# Patient Record
Sex: Female | Born: 1940 | Race: White | Hispanic: No | Marital: Married | Smoking: Never smoker
Health system: Southern US, Community
[De-identification: ages and names within clinical notes are randomized; demographics above are authoritative.]

---

## 2012-07-04 DIAGNOSIS — N318 Other neuromuscular dysfunction of bladder: Secondary | ICD-10-CM | POA: Insufficient documentation

## 2012-07-04 HISTORY — DX: Other neuromuscular dysfunction of bladder: N31.8

## 2012-08-19 DIAGNOSIS — G2581 Restless legs syndrome: Secondary | ICD-10-CM

## 2012-08-19 HISTORY — DX: Restless legs syndrome: G25.81

## 2013-06-12 DIAGNOSIS — H9190 Unspecified hearing loss, unspecified ear: Secondary | ICD-10-CM | POA: Insufficient documentation

## 2013-06-12 DIAGNOSIS — H903 Sensorineural hearing loss, bilateral: Secondary | ICD-10-CM

## 2013-06-12 DIAGNOSIS — I10 Essential (primary) hypertension: Secondary | ICD-10-CM

## 2013-06-12 HISTORY — DX: Sensorineural hearing loss, bilateral: H90.3

## 2013-06-12 HISTORY — DX: Essential (primary) hypertension: I10

## 2016-04-03 DIAGNOSIS — M47816 Spondylosis without myelopathy or radiculopathy, lumbar region: Secondary | ICD-10-CM | POA: Insufficient documentation

## 2016-04-03 DIAGNOSIS — M51369 Other intervertebral disc degeneration, lumbar region without mention of lumbar back pain or lower extremity pain: Secondary | ICD-10-CM

## 2016-04-03 DIAGNOSIS — M5416 Radiculopathy, lumbar region: Secondary | ICD-10-CM | POA: Insufficient documentation

## 2016-04-03 DIAGNOSIS — M5136 Other intervertebral disc degeneration, lumbar region: Secondary | ICD-10-CM | POA: Insufficient documentation

## 2016-04-03 HISTORY — DX: Other intervertebral disc degeneration, lumbar region without mention of lumbar back pain or lower extremity pain: M51.369

## 2016-04-03 HISTORY — DX: Other intervertebral disc degeneration, lumbar region: M51.36

## 2016-04-03 HISTORY — DX: Spondylosis without myelopathy or radiculopathy, lumbar region: M47.816

## 2016-04-03 HISTORY — DX: Radiculopathy, lumbar region: M54.16

## 2016-06-04 DIAGNOSIS — F29 Unspecified psychosis not due to a substance or known physiological condition: Secondary | ICD-10-CM | POA: Insufficient documentation

## 2016-06-08 NOTE — Progress Notes (Signed)
Psychiatric Initial Adult Assessment   Patient Identification: Ariel Archer MRN:  [id number] Date of Evaluation:  06/09/2016 Referral Source: [facility]  Chief Complaint:  "I haven't talked with Alcario Drought for years" Visit Diagnosis:    ICD-9-CM ICD-10-CM   1. 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    She states that she presents to the clinic as her husband advised to come. She states that she was brought to [facility], although she is unsure what happened. She talks about the neighbor named Cicero Duck, who can read her mind. She believes that Alcario Drought can hear what she says in the house, although Asuna thinks that is "not right". She went to talk with Alcario Drought, although she hasn't spoken with her for many years. She believes that Alcario Drought came to this clinic before, stating that "she was clearly reading the signs..she said this was the first floor." She was perseverative on this topic, stating that She had a person named Jamey Reas, who she went to the school together in [place]. She then states that "but this Alcario Drought is not Graciela Husbands... But they both has [employer] shop." She then sobbed, stating that they have not spoken with each other for many years. Although she states that she has heard Erika's voice this morning and on the way to be here, she states that "it's all gone as we talked yesterday."  She denies feeling depressed. She denies insomnia, although she could not sleep for 3 days prior to admission. She reports good energy. She denies SI, HI, VH. She denies decreased need for sleep or euphoria. She denies anxiety. She denies persecutory delusion. She denies  memory loss, stating that she lost track of time as she was in the hospital. She denies alcohol use or drug use.   Her husband [name] presents to the interview.  He believes that Brittney has been doing much better since discharge. However, he talks about an episode last night while playing cards with Brinkley. She suddenly reports that she needs to meet with a neighbor and went out of the house. She also was talking with Cicero Duck while driving to the clinic. He denies any safety issues. She takes medication as instructed. He believes that she has been forgetful, stating that she tends to [employer] card games. He sates that she has never had the similar issues before presenting to the hospital.   Per chart review, patient was admitted to [facility] in 3/23-3/25/2018.  "She says that someone e put a chip in her house as a joke and it was probably some one she knows, and since she has been hearing people talking and they can hear her. Pt says she can hear her neighbors talking and one said "i'm not going to go over there and take it out (in reference to the chip). Pt's husband confirms that they do not share walls with neighbors and there is no way she can hear them and he does not. She states "they keep saying something about a sticker". Pt reports that there has been some "friction" between her and her  neighbors and some of them are aggressive. Pt says she has even heard "them" saying what she is thinking and repeats things that she has said. Pt states "just because he can't hear them that doesn't make me crazy" (in reference to her husband). Pt states she does not know how they got into her house but knows they are playing a trick on her and put the bug in there. Pt denies SI/HI/Psychosis at this time." "Collateral Information Spoke with Scot JunSteve Roedl , spouse, in person , who states pt has been hearing voices and answering them back in the last few weeks. He says they have increased in the last 3-4 days and he  found out today she has not slept in 3 nights. Pt reportedly is difficult to understand at times and has a difficult time in conversation as she mixes words and letters. Her husband reports that she has had a feud with the neighbors behind them but is not able to hear them, she reportedly told Brett CanalesSteve that they were digging up her garden and wanted him to go see at 4 am. He also reports that he had another neighbor come over tonight and try to ensure her that they could not hear anything and at that time she refused to go to the hospital but she later woke him up saying someone had poisoned her and she wanted to go to the hospital. Brett CanalesSteve reports that pt has been having a difficult time lately and issues with short term memory and is short tempered but displaying no aggression. He denies any prior MH issues but says her mother did have some but he does not know what. " Per chart, no evidence of chronic psychotic disorder or dementia.  MMSE/MOCA/TSH/folate/B12/head CT are not available in the chart.   Associated Signs/Symptoms: Depression Symptoms:  denies (Hypo) Manic Symptoms:  denies Anxiety Symptoms:  denies Psychotic Symptoms:  Paranoia, PTSD Symptoms: NA  Past Psychiatric History:  Outpatient: denies Psychiatry admission: [facility] in 3/23-3/25/2018.  Previous suicide attempt: denies Past trials of medication: perphenazine History of violence: denies  Previous Psychotropic Medications: Yes   Substance Abuse History in the last 12 months:  No.  Consequences of Substance Abuse: NA  Past Medical History: No past medical history on file. No past surgical history on file.  Family Psychiatric History:  denies  Family History: No family history on file.  Social History:   Social History   Social History  . Marital status: Married    Spouse name: N/A  . Number of children: N/A  . Years of education: N/A   Social History Main Topics  . Smoking status: Never Smoker  . Smokeless  tobacco: Never Used  . Alcohol use No     Comment: 06-09-2016 per pt rarely  . Drug use: No  . Sexual activity: Not Asked   Other Topics Concern  . None   Social History Narrative  . None    Additional Social History:  Lives with her husband of 55 years, two children, her son lives in [place]  Born [place], [place], [place] years ago where her cousin lives. 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 L-METHYLFOLATE CALCIUM PO Take 1 tablet by mouth daily.    Marland Kitchen LORazepam (ATIVAN) 1 MG tablet Take 1 mg by mouth at bedtime as needed.    Marland Kitchen Multi Vit-Fluoride-Folic Acid (MULTIVITAMIN/FLUORIDE PO) Take 0.25 mg by mouth daily.    . propranolol (INDERAL) 10 MG tablet Take 10 mg by mouth 2 (two) times daily.    Marland Kitchen rOPINIRole (REQUIP) 1 MG tablet Take 2 tablets by mouth daily.     . ARIPiprazole (ABILIFY) 10 MG tablet Take 1 tablet (10 mg total) by mouth daily. 30 tablet 0  . meloxicam (MOBIC) 15 MG tablet Take 1 tablet by mouth daily.     No current facility-administered medications for this visit.     Neurologic: Headache: No Seizure: No Paresthesias:No  Musculoskeletal: Strength & Muscle Tone: within normal limits Gait & Station: normal Patient leans: N/A  Psychiatric Specialty Exam: Review of Systems  Psychiatric/Behavioral: Positive for hallucinations. Negative for depression, memory loss, substance abuse and suicidal ideas. The patient is not nervous/anxious and does not have insomnia.   All other systems reviewed and are  negative.   Blood pressure (!) 173/78, pulse 62, height 5' 5.5" (1.664 m), weight 134 lb 6.4 oz (61 kg).Body mass index is 22.03 kg/m.  General Appearance: Well Groomed  Eye Contact:  Good  Speech:  Clear and Coherent  Volume:  Normal  Mood:  "good"  Affect:  Labile (becomes tearful when she talks about her neighbor )  Thought Process:  Coherent and Goal Directed  Orientation:  Other:  05/31/2017, Friday, oriented to situation, self  Thought Content:  Ideas of Reference:   Paranoia and Paranoid Ideation Perceptions: AH of her neighbor (more aligned with delusion), denies VH  Suicidal Thoughts:  No  Homicidal Thoughts:  No  Memory:  Immediate;   Fair Recent;   Fair Remote;   Fair  Judgement:  Fair  Insight:  Shallow  Psychomotor Activity:  Normal  Concentration:  Concentration: Good and Attention Span: Good  Recall:  Good  Fund of Knowledge:Good  Language: Good  Akathisia:  No  Handed:  Right  AIMS (if indicated):  No tremors  Assets:  Communication Skills Desire for Improvement  ADL's:  Intact  Cognition: Impaired,  Mild  Sleep:  good  Clock drawing 2/3 (misplaced hands) Delayed recall 2/3   Assessment Corona Poth is a 76 year old female with no known psychiatry history with recent hallucinations/paranoia, hypertension, restless leg, lumbar degenerative disc disease who was presented for after care. She was admitted to [facility] in 3/23-3/25/2018 for paranoia and AH.  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 She does not have paranoia except this neighbor and has no other significant features of psychosis, which appears to be consistent during admission based on chart review. Although she has no known psychiatry history, she does have some cognitive impairment as described below; it is difficult to discern whether this  clinical presentation is due to the misperception secondary to dementia or she has delusional disorder. Will switch from perphenazine to Abilify given she could not tolerate higher dose due to dizziness. Discussed metabolic side effect.   # r/o neurocognitive disorder Her husband reports memory loss, although she adamantly denies any impairment. She has  signs of cognitive deficits based on mini cog, although she may be under the influence of her medication (perphenazine, ativan). Will plan to evaluate with MOCA at the next visit. Will obtain labs to rule out medically treatable cause of dementia. Will consider obtain MRI in the future.  Plan  1. Discontinue perphenazine 2. Start Abilify 10 mg daily 3. Continue lorazepam 1 mg at night as needed for insomnia 4. Continue Cerefoli, multivitamin  5. Return to clinic in two weeks for 30 mins 6. Obtain blood test (TSH, folate, Vit B12, lipids, HbA1c)  The patient demonstrates the following risk factors for suicide: Chronic risk factors for suicide include: psychiatric disorder of paranoia. Acute risk factors for suicide include: unemployment and recent discharge from inpatient psychiatry. Protective factors for this patient include: positive social support, coping skills and hope for the future. Considering these factors, the overall suicide risk at this point appears to be low. Patient is appropriate for outpatient follow up.   Treatment Plan Summary: Plan as above   Neysa Hotter, MD 3/28/20183:53 PM

## 2016-06-09 ENCOUNTER — Encounter (HOSPITAL_COMMUNITY): Payer: Self-pay | Admitting: Psychiatry

## 2016-06-09 ENCOUNTER — Encounter (INDEPENDENT_AMBULATORY_CARE_PROVIDER_SITE_OTHER): Payer: Self-pay

## 2016-06-09 ENCOUNTER — Ambulatory Visit (INDEPENDENT_AMBULATORY_CARE_PROVIDER_SITE_OTHER): Payer: Medicare Other | Admitting: Psychiatry

## 2016-06-09 VITALS — BP 173/78 | HR 62 | Ht 65.5 in | Wt 134.4 lb

## 2016-06-09 DIAGNOSIS — G3184 Mild cognitive impairment, so stated: Secondary | ICD-10-CM

## 2016-06-09 DIAGNOSIS — F22 Delusional disorders: Secondary | ICD-10-CM | POA: Diagnosis not present

## 2016-06-09 DIAGNOSIS — Z79899 Other long term (current) drug therapy: Secondary | ICD-10-CM | POA: Diagnosis not present

## 2016-06-09 MED ORDER — ARIPIPRAZOLE 10 MG PO TABS: 10.0000 mg | ORAL_TABLET | Freq: Every day | ORAL | 0 refills | Status: DC

## 2016-06-09 NOTE — Patient Instructions (Addendum)
1. Discontinue perphenazine 2. Start Abilify 10 mg daily 3. Continue lorazepam 1 mg at night as needed for insomnia 4. Continue Cerefoli, multivitamin  5. Return to clinic in two weeks for 30 mins 6. Obtain blood test

## 2016-06-10 LAB — LIPID PANEL
Cholesterol: 192 mg/dL (ref <200)
HDL: 57 mg/dL (ref >50)
LDL CALC: 114 mg/dL — AB (ref <100)
TRIGLYCERIDES: 106 mg/dL (ref <150)
Total CHOL/HDL Ratio: 3.4 Ratio (ref <5.0)
VLDL: 21 mg/dL (ref <30)

## 2016-06-10 LAB — VITAMIN B12

## 2016-06-10 LAB — FOLATE: Folate: >24 ng/mL (ref >5.4)

## 2016-06-10 LAB — TSH: TSH: 1 mIU/L

## 2016-06-11 LAB — HEMOGLOBIN A1C
Hgb A1c MFr Bld: 5.4 % (ref <5.7)
Mean Plasma Glucose: 108 mg/dL

## 2016-06-14 ENCOUNTER — Telehealth (HOSPITAL_COMMUNITY): Payer: Self-pay | Admitting: *Deleted

## 2016-06-14 NOTE — Telephone Encounter (Signed)
Patient husband called to ask about times patient should take medication and wanted to verify next appointment date.

## 2016-06-15 ENCOUNTER — Telehealth (HOSPITAL_COMMUNITY): Payer: Self-pay | Admitting: *Deleted

## 2016-06-15 NOTE — Telephone Encounter (Signed)
returned phone call, left voice message.  No detail was left on voice message regarding the phone call.

## 2016-06-16 ENCOUNTER — Telehealth (HOSPITAL_COMMUNITY): Payer: Self-pay | Admitting: *Deleted

## 2016-06-16 NOTE — Telephone Encounter (Signed)
Returned phone call regarding phone call from patient's husband, no answer, left voice message.

## 2016-06-18 NOTE — Progress Notes (Signed)
BH MD/PA/NP OP Progress Note  06/23/2016 2:36 PM Gracelee Stemmler  MRN:  [id number]  Chief Complaint:  Chief Complaint    Follow-up; Paranoid     Subjective:  "I feel fine" HPI:  Patient presents for follow-up appointment. She states that she has no issues and talks about the garden she raises flowers. When she is asked about the neighbor, she states that "they do different things.. They are jealousy." When she is asked to elaborate it, she then talks about her cousin who put tape recording in her house. She states that "that's why I was hearing voice." (when her husband is asked if he also head voice, she laughed, stating that there will be a problem if both of them hear voices.) Although her cousin initially denies this, she later admits she did it while laughing. She reports that her cousin and her "person behind me (neighbor)" are talking with each other and talking about her. She believes that they were "calling names" last week. When she is asked if she saw a neighbor, she states that "If I see them, I say hello." She denies insomnia. She denies depression/euphoria. She denies SI, VH. She denies dizziness since switching to Abilify. She denies alcohol use or drug use.  [name], her husband presents to the interview.  He states that Drake appear to be doing better, "low key." She heard voice this morning. She brought a cake to the neighbor, thinking that she was invited to the house, although nobody was there. She was states that she was told a "bad person" and called whore by the neighbor. He reports no episode of agitation or safety issues.   Wt Readings from Last 3 Encounters:  06/23/16 134 lb 9.6 oz (61.1 kg)  06/09/16 134 lb 6.4 oz (61 kg)   Visit Diagnosis:    ICD-9-CM ICD-10-CM   1. 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  Previous suicide attempt: denies Past trials of medication: perphenazine History of violence: denies  Past Medical History: No past medical history on file. No past surgical history on file.  Family Psychiatric History:  denies  Family History: No family history on file.  Social History:  Social History   Social History  . Marital status: Married    Spouse name: N/A  . Number of children: N/A  . Years of education: N/A   Social History Main Topics  . Smoking status: Never Smoker  . Smokeless tobacco: Never Used  . Alcohol use No     Comment: 06-09-2016 per pt rarely  . Drug use: No  . Sexual activity: Not Asked   Other Topics Concern  . None   Social History Narrative  . None   Additional Social History:  Lives with her husband of 55 years, two children, her son lives in [place] and grew up in [place], moved to [place], moved from [place] to [place] 7 years ago where her cousin lives. 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 ARIPiprazole (ABILIFY) 15 MG tablet Take 1 tablet (15 mg total) by mouth daily. 60 tablet 0  . L-METHYLFOLATE CALCIUM PO Take 1 tablet by mouth daily.    Marland Kitchen LORazepam (ATIVAN) 1 MG tablet Take 1 tablet (1 mg total) by mouth at bedtime as needed. 30 tablet 0  . meloxicam (MOBIC) 15 MG tablet Take 1 tablet by mouth daily.    Marland Kitchen Multi Vit-Fluoride-Folic Acid  (MULTIVITAMIN/FLUORIDE PO) Take 0.25 mg by mouth daily.    . propranolol (INDERAL) 10 MG tablet Take 10 mg by mouth 2 (two) times daily.    Marland Kitchen rOPINIRole (REQUIP) 1 MG tablet Take 2 tablets by mouth daily.      No current facility-administered medications for this visit.     Neurologic: Headache: No Seizure: No Paresthesias: No  Musculoskeletal: Strength & Muscle Tone: within normal limits Gait & Station: normal Patient leans: N/A  Psychiatric Specialty Exam: Review of Systems  Neurological: Negative for dizziness and headaches.  Psychiatric/Behavioral: Positive for hallucinations. Negative for depression, substance abuse and suicidal ideas. The patient is not nervous/anxious and does not have insomnia.   All other systems reviewed and are negative.   Blood pressure (!) 198/75, pulse (!) 59, height 5' 5.5" (1.664 m), weight 134 lb 9.6 oz (61.1 kg).Body mass index is 22.06 kg/m.  General Appearance: Well Groomed  Eye Contact:  Good  Speech:  Clear and Coherent  Volume:  Normal  Mood:  "good"  Affect:  Appropriate, Congruent and Full Range  Thought Process:  Coherent and Linear, disorganized when she is asked to elaborate about the neighbor.  Orientation:  Full (Time, Place, and Person) except date  Thought Content: Paranoid Ideation   Suicidal Thoughts:  No  Homicidal Thoughts:  No  Memory:  Immediate;   Good Recent;   Good Remote;   Good  Judgement:  Fair  Insight:  Present  Psychomotor Activity:  Normal  Concentration:  Concentration: Good and Attention Span: Good  Recall:  Good  Fund of Knowledge: Good  Language: Good  Akathisia:  No  Handed:  Right  AIMS (if indicated):  No tremors, very mild rigidity on her left arm  Assets:  Communication Skills Desire for Improvement  ADL's:  Intact  Cognition: Impaired,  Mild  Sleep:  good  MOCA 22/30 on 06/23/2016 (-1 for attention, -1 for language, -5 or delayed recall, -1 for orientation "April 8th")  Assessment Korey  Akamine is a 76 year old female with no known psychiatry history, hypertension, restless leg, lumbar degenerative disc disease who was originally referred after care since discharge from [facility] in 3/23-3/25/2018 for paranoia and AH. She presents for follow up appointment.   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 Although she becomes  disorganized when she talks about her neighbor, her thought process is linear otherwise and she does not have other features of psychosis. Noted that this appear to be consistent with evaluation during admission per chart review. She also has mild cognitive deficits which was evident on MOCA as  above. There is a possibility that paranoia is related to dementia. Patient has no known family history of dementia. Patient does not have any features of parkinson's syndrome to suggest Lewy's body except very mild rigidity on her left arm; will continue to monitor this and consider switch to less potent antipsychotic to avoid adverse reaction. Regardless, will obtain brain MRI to rule out organic cause of her mental status change and for evaluation of dementia. TSH, folate, vit B12 with wnl. Will increase Abilify to target her paranoia. Discussed metabolic side effect.   # Hypertension Her blood pressure is markedly elevated on each visit. She denies any dizziness or headache during the evaluation. Patient is recommended to recheck blood pressure outside of the clinic and update the clinic of her blood pressure. She will be recommended to contact her PCP if her blood pressure remains above >150.  Plan  1. Discontinue perphenazine 2. Start Abilify 10 mg daily 3. Continue lorazepam 1 mg at night as needed for insomnia 4. Continue Cerefoli, multivitamin  5. Return  to clinic in two weeks for 30 mins 6. Obtain blood test (TSH, folate, Vit B12, lipids, HbA1c)  The patient demonstrates the following risk factors for suicide: Chronic risk factors for suicide include: psychiatric disorder of paranoia. Acute risk factors for suicide include: unemployment and recent discharge from inpatient psychiatry. Protective factors for this patient include: positive social support, coping skills and hope for the future. Considering these factors, the overall suicide risk at this point appears to be low. Patient is appropriate for outpatient follow up.  Treatment Plan Summary:Plan as above  The duration of this appointment visit was 30 minutes of face-to-face time with the patient.  Greater than 50% of this time was spent in counseling, explanation of  diagnosis, planning of further management, and coordination of care.  Neysa Hotter, MD 06/23/2016, 2:36 PM

## 2016-06-23 ENCOUNTER — Telehealth (HOSPITAL_COMMUNITY): Payer: Self-pay | Admitting: *Deleted

## 2016-06-23 ENCOUNTER — Ambulatory Visit (INDEPENDENT_AMBULATORY_CARE_PROVIDER_SITE_OTHER): Payer: Medicare Other | Admitting: Psychiatry

## 2016-06-23 ENCOUNTER — Encounter (HOSPITAL_COMMUNITY): Payer: Self-pay | Admitting: Psychiatry

## 2016-06-23 VITALS — BP 198/75 | HR 59 | Ht 65.5 in | Wt 134.6 lb

## 2016-06-23 DIAGNOSIS — F22 Delusional disorders: Secondary | ICD-10-CM

## 2016-06-23 DIAGNOSIS — G3184 Mild cognitive impairment, so stated: Secondary | ICD-10-CM

## 2016-06-23 DIAGNOSIS — Z79899 Other long term (current) drug therapy: Secondary | ICD-10-CM | POA: Diagnosis not present

## 2016-06-23 MED ORDER — ARIPIPRAZOLE 15 MG PO TABS: 15.0000 mg | ORAL_TABLET | Freq: Every day | ORAL | 0 refills | Status: DC

## 2016-06-23 MED ORDER — ARIPIPRAZOLE 15 MG PO TABS: 15.0000 mg | ORAL_TABLET | Freq: Every day | ORAL | 1 refills | Status: DC

## 2016-06-23 MED ORDER — LORAZEPAM 1 MG PO TABS: 1.0000 mg | ORAL_TABLET | Freq: Every evening | ORAL | 0 refills | Status: DC | PRN

## 2016-06-23 NOTE — Telephone Encounter (Signed)
phone call from patient's husband with patient's blood pressure.  171 over 74.

## 2016-06-23 NOTE — Patient Instructions (Signed)
1. Increase Abilify 15 mg daily 2. Obtain MRI 3. Continue lorazepam 1 mg at night as needed for insomnia 4. Continue Cerefoli, multivitamin  5. Return to clinic in one month for 30 mins

## 2016-06-24 NOTE — Telephone Encounter (Signed)
phone call from patient's husband with patient's blood pressure.  171 over 74.   

## 2016-06-25 NOTE — Telephone Encounter (Signed)
Please advise them to see her PCP for hypertension.

## 2016-06-25 NOTE — Telephone Encounter (Signed)
Called pt home and cell number and was unable to reach pt. lmtcb and office number was provided on both numbers. In voicemail, staff informed pt with office hours.

## 2016-06-26 ENCOUNTER — Ambulatory Visit (HOSPITAL_BASED_OUTPATIENT_CLINIC_OR_DEPARTMENT_OTHER)
Admission: RE | Admit: 2016-06-26 | Discharge: 2016-06-26 | Disposition: A | Discharge status: Home / Self Care | Payer: Medicare Other | Source: Ambulatory Visit | Attending: Psychiatry | Admitting: Psychiatry

## 2016-06-26 DIAGNOSIS — F22 Delusional disorders: Secondary | ICD-10-CM | POA: Insufficient documentation

## 2016-06-26 DIAGNOSIS — G3184 Mild cognitive impairment, so stated: Secondary | ICD-10-CM | POA: Diagnosis present

## 2016-07-05 ENCOUNTER — Telehealth (HOSPITAL_COMMUNITY): Payer: Self-pay | Admitting: *Deleted

## 2016-07-05 NOTE — Telephone Encounter (Signed)
Spoke with pt husband and he was wondering what time to give pt her medications that Dr. Vanetta Shawl prescribes to him. Per pt husband, he just want to make sure she is taking her medications that right way.

## 2016-07-05 NOTE — Telephone Encounter (Signed)
Ariel Archer returned Fox phone call.

## 2016-07-05 NOTE — Telephone Encounter (Signed)
returned phone call to Mr. Nerio. left voice message.   He has concerns for wife's medications.

## 2016-07-05 NOTE — Telephone Encounter (Signed)
Called husband back due to previous phone call. Pt picked up the call and stated her husband went out to run some errands. Asked pt if she remembered the reason husband called office. Per pt she remember not having the energy to stand and was very dizzy. Per pt, her husband called her PCP and spoke with a nurse and they went over her medications but she do not remember anymore information. Pt stated she will have husband call office back to give office more details due to not being able to remember information. Staff provided pt with office number and pt stated she wrote it down.

## 2016-07-06 NOTE — Telephone Encounter (Signed)
O would suggest she take the abilify at night and ativan as needed at night also

## 2016-07-07 NOTE — Telephone Encounter (Signed)
Called pt husband to inform him with what provider stated. Per pt husband he verbalized understanding.

## 2016-07-21 NOTE — Progress Notes (Signed)
BH MD/PA/NP OP Progress Note  07/26/2016 1:39 PM Ariel Archer  MRN:  [id number]  Chief Complaint:  Chief Complaint    Follow-up; Paranoid     Subjective:  "I feel floating" HPI:  - Brain MRI with no significant finding.  Patient presents for follow-up appointment. She states that she feels bloating after increasing Abilify. Although she went on the cruise, it was difficult for her to handle dizziness. She has not had any voices or paranoia since the last appointment. She had occasional insomnia and takes ativan a few times. She has good appetite. She denies SI, HI, AH/VH. She denies feeling anxious. She denies any medication change other than Abilify.   Brett CanalesSteve, her husband presents to the interview.  She seems to be doing very well since the last appointment. She has not had any paranoia or voices. They get along well with the neighbor as well. Brett CanalesSteve has no concern other than her dizziness.   Wt Readings from Last 3 Encounters:  07/26/16 134 lb 6.4 oz (61 kg)  06/23/16 134 lb 9.6 oz (61.1 kg)  06/09/16 134 lb 6.4 oz (61 kg)   Visit Diagnosis:    ICD-9-CM ICD-10-CM   1. 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  Previous suicide attempt: denies Past trials of medication: perphenazine History of violence: denies  Past Medical History: No past medical history on file. No past surgical history on file.  Family Psychiatric History:  denies  Family History: No family history on file.  Social History:  Social History   Social History  . Marital status: Married    Spouse name: N/A  . Number of children: N/A  . Years of education: N/A   Social History Main Topics  . Smoking status: Never Smoker  . Smokeless tobacco: Never Used  . Alcohol use No     Comment: 06-09-2016 per pt rarely  . Drug use: No  . Sexual activity: Not Asked   Other Topics Concern  . None   Social History Narrative   . None   Additional Social History:  Lives with her husband of 55 years, two children, her son lives in [place]  Born [place], [place] ago where her cousin lives. 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 ARIPiprazole (ABILIFY) 10 MG tablet Take 1 tablet (10 mg total) by mouth daily. 30 tablet 1  . L-METHYLFOLATE CALCIUM PO Take 1 tablet by mouth daily.    Marland Kitchen. LORazepam (ATIVAN) 1 MG tablet Take 1 tablet (1 mg total) by mouth at bedtime as needed. 30 tablet 0  . meloxicam (MOBIC) 15 MG tablet Take 1 tablet by mouth daily.    Marland Kitchen. Multi Vit-Fluoride-Folic Acid (MULTIVITAMIN/FLUORIDE PO) Take 0.25 mg  by mouth daily.    Marland Kitchen rOPINIRole (REQUIP) 1 MG tablet Take 2 tablets by mouth daily.     Marland Kitchen lisinopril (PRINIVIL,ZESTRIL) 40 MG tablet Take 40 mg by mouth daily.  3  . propranolol (INDERAL) 10 MG tablet Take 10 mg by mouth 2 (two) times daily.     No current facility-administered medications for this visit.     Brain MRI 06/26/2016 FINDINGS: Brain: Mild atrophy and white matter changes are within normal limits for age. No acute infarct, hemorrhage, or mass lesion is present. The ventricles are of normal size. No significant extraaxial fluid collection is present. The ventricles are proportionate to the degree of atrophy.  Vascular: Flow is present in the major  intracranial arteries.  Skull and upper cervical spine: The skull base is within normal limits. The craniocervical junction is normal. Midline sagittal structures are within normal limits.  Sinuses/Orbits: Mild mucosal thickening is present in the maxillary sinuses bilaterally. No fluid levels are present. The remaining paranasal sinuses and mastoid air cells are clear. Globes and orbits are within normal limits.  IMPRESSION: 1. Normal MRI appearance of the brain for age.  Neurologic: Headache: No Seizure: No Paresthesias: No  Musculoskeletal: Strength & Muscle Tone: within normal limits Gait & Station: normal Patient leans: N/A  Psychiatric Specialty Exam: Review of Systems  Neurological: Positive for dizziness. Negative for headaches.  Psychiatric/Behavioral: Negative for depression, hallucinations, substance abuse and suicidal ideas. The patient has insomnia. The patient is not nervous/anxious.   All other systems reviewed and are negative.   Blood pressure (!) 169/69, pulse (!) 58, height 5' 5.5" (1.664 m), weight 134 lb 6.4 oz (61 kg).Body mass index is 22.03 kg/m.  General Appearance: Well Groomed  Eye Contact:  Good  Speech:  Clear and Coherent  Volume:  Normal  Mood:  "good"  Affect:  Appropriate, Congruent and Full Range  Thought Process:  Coherent and Linear  Orientation:  Full (Time, Place, and Person)   Thought Content: Paranoid Ideation   Suicidal Thoughts:  No  Homicidal Thoughts:  No  Memory:  Immediate;   Good Recent;   Good Remote;   Good  Judgement:  Fair  Insight:  Present  Psychomotor Activity:  Normal  Concentration:  Concentration: Good and Attention Span: Good  Recall:  Good  Fund of Knowledge: Good  Language: Good  Akathisia:  No  Handed:  Right  AIMS (if indicated):  No tremors, very mild rigidity on her left arm  Assets:  Communication Skills Desire for Improvement  ADL's:  Intact  Cognition: Impaired,  Mild  Sleep:  good  MOCA  22/30 on 06/23/2016 (-1 for attention, -1 for language, -5 or delayed recall, -1 for orientation "April 8th")  Assessment Ariel Archer is a 76 year old female with no known psychiatry history, hypertension, restless leg, lumbar degenerative disc disease who was originally referred after care since discharge from [facility] in 3/23-3/25/2018 for paranoia and AH. She presents for follow up appointment for paranoia.   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 After having discussion of different doses with the patient, will decrease to the original dose of 10 mg given patient significant concern about dizziness. She is instructed to contact the office if she continues to have dizziness and/or any worsening in her paranoia/AH. It  is still difficult to discern the cause of her paranoia and mild disorganization when she talks about her paranoia. Differential including delusional disorder or dementia given cognitive deficits she has on MOCA. Patient does not have any features of parkinson's syndrome to suggest Lewy's body except very mild rigidity on her left arm; will continue to monitor this and consider switch to less potent antipsychotic to avoid adverse reaction. MRI with no significant abnormality except mild atrophy and white matter changes which are within normal limits for age. TSH, folate, vit B12 with wnl. Will continue to monitor her cognition.   # Hypertension She is advised to contact her PCP for treatment for hypertension.   Plan  1. Decrease Abilify 10 mg daily 2. Continue lorazepam 1 mg at night as needed for insomnia, dispensed for 30 days 3. Continue Cerefoli, multivitamin  4. Return to clinic in two weeks for 30 mins  The patient demonstrates  the following risk factors for suicide: Chronic risk factors for suicide include: psychiatric disorder of paranoia. Acute risk factors for suicide include: unemployment and recent discharge from inpatient psychiatry. Protective factors for this patient include: positive social support, coping skills and hope for the future. Considering these factors, the overall suicide risk at this point appears to be low. Patient is appropriate for outpatient follow up.  Treatment Plan Summary:Plan as above  The duration of this appointment visit was 30 minutes of face-to-face time with the patient.  Greater than 50% of this time was spent in counseling, explanation of  diagnosis, planning of further management, and coordination of care.  Neysa Hotter, MD 07/26/2016, 1:39 PM

## 2016-07-26 ENCOUNTER — Encounter (HOSPITAL_COMMUNITY): Payer: Self-pay | Admitting: Psychiatry

## 2016-07-26 ENCOUNTER — Ambulatory Visit (INDEPENDENT_AMBULATORY_CARE_PROVIDER_SITE_OTHER): Payer: Medicare Other | Admitting: Psychiatry

## 2016-07-26 VITALS — BP 169/69 | HR 58 | Ht 65.5 in | Wt 134.4 lb

## 2016-07-26 DIAGNOSIS — G3184 Mild cognitive impairment, so stated: Secondary | ICD-10-CM

## 2016-07-26 DIAGNOSIS — Z79899 Other long term (current) drug therapy: Secondary | ICD-10-CM

## 2016-07-26 DIAGNOSIS — I10 Essential (primary) hypertension: Secondary | ICD-10-CM

## 2016-07-26 DIAGNOSIS — F22 Delusional disorders: Secondary | ICD-10-CM | POA: Diagnosis not present

## 2016-07-26 MED ORDER — ARIPIPRAZOLE 10 MG PO TABS: 10.0000 mg | ORAL_TABLET | Freq: Every day | ORAL | 1 refills | Status: DC

## 2016-07-26 MED ORDER — LORAZEPAM 1 MG PO TABS: 1.0000 mg | ORAL_TABLET | Freq: Every evening | ORAL | 0 refills | Status: DC | PRN

## 2016-07-26 NOTE — Patient Instructions (Signed)
1. Decrease Abilify 10 mg daily 2. Continue lorazepam 1 mg at night as needed for insomnia 3. Continue Cerefoli, multivitamin  4. Return to clinic in two weeks for 30 mins
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
## 2016-08-26 ENCOUNTER — Ambulatory Visit (INDEPENDENT_AMBULATORY_CARE_PROVIDER_SITE_OTHER): Payer: Medicare Other | Admitting: Psychiatry

## 2016-08-26 ENCOUNTER — Encounter (HOSPITAL_COMMUNITY): Payer: Self-pay | Admitting: Psychiatry

## 2016-08-26 VITALS — BP 160/98 | HR 72 | Ht 65.5 in | Wt 130.0 lb

## 2016-08-26 DIAGNOSIS — F22 Delusional disorders: Secondary | ICD-10-CM | POA: Insufficient documentation

## 2016-08-26 DIAGNOSIS — Z791 Long term (current) use of non-steroidal anti-inflammatories (NSAID): Secondary | ICD-10-CM

## 2016-08-26 DIAGNOSIS — G2581 Restless legs syndrome: Secondary | ICD-10-CM | POA: Diagnosis not present

## 2016-08-26 DIAGNOSIS — G3184 Mild cognitive impairment, so stated: Secondary | ICD-10-CM | POA: Diagnosis not present

## 2016-08-26 DIAGNOSIS — Z79899 Other long term (current) drug therapy: Secondary | ICD-10-CM | POA: Diagnosis not present

## 2016-08-26 DIAGNOSIS — I10 Essential (primary) hypertension: Secondary | ICD-10-CM

## 2016-08-26 DIAGNOSIS — M5136 Other intervertebral disc degeneration, lumbar region: Secondary | ICD-10-CM | POA: Diagnosis not present

## 2016-08-26 MED ORDER — ARIPIPRAZOLE 5 MG PO TABS: 12.5000 mg | ORAL_TABLET | Freq: Every day | ORAL | 1 refills | Status: DC

## 2016-08-26 NOTE — Patient Instructions (Addendum)
1. Increase Abilify 12.5 mg daily 2. Continue lorazepam 1 mg at night as needed for insomnia 3. Continue Cerefoli, multivitamin  4. Return to clinic in three weeks for 30 mins

## 2016-09-08 NOTE — Progress Notes (Signed)
BH MD/PA/NP OP Progress Note  09/16/2016 1:44 PM Ariel Archer  MRN:  [id number]  Chief Complaint:  Chief Complaint    Follow-up; Paranoid     Subjective:  "Thy are loud"  HPI:  - Patient was evaluated by Mr. Gracelyn Nurse, FNP for dizziness. CMP, CBC, UA with no abnormality to explain dizziness per chart.  Patient presents for follow up appointment for paranoia. She complains of dizziness which she has for a couple of months. She denies any worsening since the last appointment. Although she still hears her neighbors voice (which her husband does ot hear), she is not concerned as before, as they are not talking about her. She remembers she told [name] not going to [employer] and steal foods. Although she initially states it was a "joke," she later states that she heard voice of neigbor talking about this. She and [name] are thinking of moving the house as they are too big and they cannot take care of their yard anymore. She denies SI, HI, VH. She has not taken ativan since the last appointment.   [name], her husband states that she was doing better when she was out of the house, like cruise or graduation party.   Wt Readings from Last 3 Encounters:  09/16/16 127 lb (57.6 kg)  08/26/16 130 lb (59 kg)  07/26/16 134 lb 6.4 oz (61 kg)    Visit Diagnosis: No diagnosis found.  Past Psychiatric History:  I have reviewed the patient's psychiatry history in detail and updated the patient record. Outpatient: denies Psychiatry admission: [facility] in 3/23-3/25/2018.  Previous suicide attempt: denies Past trials of medication: perphenazine History of violence: denies  Past Medical History: History reviewed. No pertinent past medical history. History reviewed. No pertinent surgical history.  Family Psychiatric History:  I have reviewed the patient's family history in detail and updated the patient record.  Family History: History reviewed. No pertinent family history.  Social History:  Social  History   Social History  . Marital status: Married    Spouse name: N/A  . Number of children: N/A  . Years of education: N/A   Social History Main Topics  . Smoking status: Never Smoker  . Smokeless tobacco: Never Used  . Alcohol use No     Comment: 06-09-2016 per pt rarely  . Drug use: No  . Sexual activity: Not Asked   Other Topics Concern  . None   Social History Narrative  . None    Allergies: No Known Allergies  Metabolic Disorder Labs: Lab Results  Component Value Date   HGBA1C 5.4 06/10/2016   MPG 108 06/10/2016   No results found for: PROLACTIN Lab Results  Component Value Date   CHOL 192 06/10/2016   TRIG 106 06/10/2016   HDL 57 06/10/2016   CHOLHDL 3.4 06/10/2016   VLDL 21 06/10/2016   LDLCALC 114 (H) 06/10/2016     Current Medications: Current Outpatient Prescriptions  Medication Sig Dispense Refill  . ARIPiprazole (ABILIFY) 5 MG tablet Take 2.5 tablets (12.5 mg total) by mouth daily. 75 tablet 1  . L-METHYLFOLATE CALCIUM PO Take 1 tablet by mouth daily.    Marland Kitchen lisinopril (PRINIVIL,ZESTRIL) 40 MG tablet Take 40 mg by mouth daily.  3  . LORazepam (ATIVAN) 1 MG tablet Take 1 tablet (1 mg total) by mouth at bedtime as needed. 30 tablet 0  . meloxicam (MOBIC) 15 MG tablet Take 1 tablet by mouth daily.    Marland Kitchen Multi Vit-Fluoride-Folic Acid (MULTIVITAMIN/FLUORIDE PO) Take 0.25  mg by mouth daily.    Marland Kitchen. rOPINIRole (REQUIP) 1 MG tablet Take 2 tablets by mouth daily.      No current facility-administered medications for this visit.     Neurologic: Headache: No Seizure: No Paresthesias: No   Musculoskeletal: Strength & Muscle Tone: within normal limits Gait & Station: normal Patient leans: N/A  Psychiatric Specialty Exam: Review of Systems  Neurological: Positive for dizziness.  Psychiatric/Behavioral: Positive for hallucinations. Negative for depression, memory loss, substance abuse and suicidal ideas. The patient is not nervous/anxious and does not  have insomnia.   All other systems reviewed and are negative.   Blood pressure 136/90, pulse 70, height 5' 5.5" (1.664 m), weight 127 lb (57.6 kg).Body mass index is 20.81 kg/m.  General Appearance: Fairly Groomed  Eye Contact:  Good  Speech:  Clear and Coherent  Volume:  Normal  Mood:  "fine"  Affect:  anxious, slightly restricted  Thought Process:  Coherent perseverates on her neighbor  Orientation:  Full (Time, Place, and Person)  Thought Content: Paranoid Ideation Perceptions: AH of neighbor, denies VH  Suicidal Thoughts:  No  Homicidal Thoughts:  No  Memory:  Immediate;   Good Recent;   Good Remote;   Good  Judgement:  Fair  Insight:  Fair  Psychomotor Activity:  Normal  Concentration:  Concentration: Good and Attention Span: Good  Recall:  Good  Fund of Knowledge: Good  Language: Good  Akathisia:  No  Handed:  Right  AIMS (if indicated):  No tremors, no rigidity  Assets:  Communication Skills Desire for Improvement  ADL's:  Intact  Cognition: WNL  Sleep:  good  MOCA 22/30 on 06/23/2016 (-1 for attention, -1 for language, -5 or delayed recall, -1 for orientation "April 8th")  Assessment Ariel Archer is a 76 year old female with no known psychiatry history, hypertension, restless leg, lumbar degenerative disc diseasewho was originally referred after care since discharge from [facility] in 3/23-3/25/2018 for paranoia and AH. Patient presents for follow up appointment for Delusional disorder, persecutory type, first episode, currently in acute episode [facility](HCC)  # Delusional disorder Patient continues to have ego dystonic paranoia about her neighbors, although it appears to be improved slightly since uptitration of Abilify. Will increase further to target paranoia while monitoring for dizziness (she could not tolerate 15 mg). No other signs to concern for psychotic disorder. She will be continued lorazepam prn for anxiety related to paranoia.   # Mild neurocognitive  disorder She has mild cognitive deficits as evidenced in MOCA. Will continue to monitor. Head MRI, labs with no significant abnormality to explain cognitive deficits.   Plan  1. Increase Abilify 14 mg daily 2. Return to clinic in one month for 30 mins  The patient demonstrates the following risk factors for suicide: Chronic risk factors for suicide include: psychiatric disorder of paranoia. Acute risk factorsfor suicide include: unemployment and recent discharge from inpatient psychiatry. Protective factorsfor this patient include: positive social support, coping skills and hope for the future. Considering these factors, the overall suicide risk at this point appears to be low. Patient isappropriate for outpatient follow up.  The duration of this appointment visit was 30 minutes of face-to-face time with the patient.  Greater than 50% of this time was spent in counseling, explanation of  diagnosis, planning of further management, and coordination of care.  Treatment Plan Summary:Plan as above   Neysa Hottereina Emiliano Welshans, MD 09/16/2016, 1:44 PM

## 2016-09-16 ENCOUNTER — Ambulatory Visit (INDEPENDENT_AMBULATORY_CARE_PROVIDER_SITE_OTHER): Payer: Medicare Other | Admitting: Psychiatry

## 2016-09-16 ENCOUNTER — Encounter (HOSPITAL_COMMUNITY): Payer: Self-pay | Admitting: Psychiatry

## 2016-09-16 VITALS — BP 136/90 | HR 70 | Ht 65.5 in | Wt 127.0 lb

## 2016-09-16 DIAGNOSIS — G3184 Mild cognitive impairment, so stated: Secondary | ICD-10-CM | POA: Diagnosis not present

## 2016-09-16 DIAGNOSIS — R443 Hallucinations, unspecified: Secondary | ICD-10-CM

## 2016-09-16 DIAGNOSIS — F22 Delusional disorders: Secondary | ICD-10-CM | POA: Diagnosis not present

## 2016-09-16 MED ORDER — ARIPIPRAZOLE 10 MG PO TABS: ORAL_TABLET | ORAL | 1 refills | Status: DC

## 2016-09-16 MED ORDER — ARIPIPRAZOLE 2 MG PO TABS: ORAL_TABLET | ORAL | 1 refills | Status: DC

## 2016-09-16 NOTE — Patient Instructions (Signed)
1. Increase Abilify 14 mg daily 2. Return to clinic in one month for 30 mins

## 2016-09-17 ENCOUNTER — Telehealth (HOSPITAL_COMMUNITY): Payer: Self-pay | Admitting: *Deleted

## 2016-09-20 ENCOUNTER — Telehealth (HOSPITAL_COMMUNITY): Payer: Self-pay | Admitting: *Deleted

## 2016-09-20 NOTE — Telephone Encounter (Signed)
noted 

## 2016-09-20 NOTE — Telephone Encounter (Signed)
Called for prior authorization of Abilify on 09/17/16 was on hold for 1 hour, called back on 09/20/16 was given  PA #[id number] and told that pharmacist will review with a decision within 24-48 hours.

## 2016-10-12 ENCOUNTER — Telehealth (HOSPITAL_COMMUNITY): Payer: Self-pay | Admitting: *Deleted

## 2016-10-12 NOTE — Progress Notes (Signed)
BH MD/PA/NP OP Progress Note  10/18/2016 2:35 PM Ariel Archer  MRN:  [id number]  Chief Complaint:  Chief Complaint    Other; Follow-up     Subjective:  "I'm doing good" HPI:  Patient presents for follow up appointment for paranoia.  She states that she has been doing better since the last appointment. She put the house on sale, and is hoping to move out by September. She believes that her neighbor was "doing things having fun" and she tries to let it go. Although her cousin and the neighbor is friend now, she does not care it anymore as the cousin live apart from her. When she is asked about the statement of device, she states that "things are open" and she is not concerned about it anymore. She denies feeling depressed or anxiety. She has fair appetite. She denies SI, HI, AH/VH.  She denies ideas of reference.    Her husband presents at the interview He believes that she is doing much better despite being on the same dose of Abilify (could not get higher dose from pharmacy). She talks less about the neighbor.   Wt Readings from Last 3 Encounters:  10/18/16 127 lb (57.6 kg)  09/16/16 127 lb (57.6 kg)  08/26/16 130 lb (59 kg)    Visit Diagnosis:    ICD-10-CM   1. 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 Outpatient: denies Psychiatry admission: [facility] in 3/23-3/25/2018 for paranoia and AH.  Previous suicide attempt: denies Past trials of medication: perphenazine History of violence: denies  Past Medical History: No past medical history on file. No past surgical history on file.  Family Psychiatric History:  .I have reviewed the patient's family history in detail and updated the patient record.  Family History: No family history on file.  Social History:  Social History   Social History  . Marital  status: Married    Spouse name: N/A  . Number of children: N/A  . Years of education: N/A   Social History Main Topics  . Smoking status: Never Smoker  . Smokeless tobacco: Never Used  . Alcohol use No     Comment: 06-09-2016 per pt rarely  . Drug use: No  . Sexual activity: Not on file   Other Topics Concern  . Not on file   Social History Narrative  . No narrative on file    Allergies: No Known Allergies  Metabolic Disorder Labs: Lab Results  Component Value Date   HGBA1C 5.4 06/10/2016   MPG 108 06/10/2016   No results found for: PROLACTIN Lab Results  Component Value Date   CHOL 192 06/10/2016   TRIG 106 06/10/2016   HDL 57 06/10/2016   CHOLHDL 3.4 06/10/2016   VLDL 21 06/10/2016   LDLCALC 114 (H) 06/10/2016     Current Medications: Current Outpatient Prescriptions  Medication Sig Dispense Refill  . ARIPiprazole (ABILIFY) 10 MG tablet Total of 12.5 mg (10 mg +2.5 mg) daily 30 tablet 1  . L-METHYLFOLATE CALCIUM PO Take 1 tablet by mouth daily.    Marland Kitchen. lisinopril (PRINIVIL,ZESTRIL) 40 MG tablet Take 40 mg by mouth daily.  3  . LORazepam (ATIVAN) 1 MG tablet Take 1 tablet (1 mg total) by mouth at bedtime as needed. 30 tablet 0  . meloxicam (MOBIC) 15 MG tablet Take 1 tablet by mouth daily.    .Marland Kitchen  Multi Vit-Fluoride-Folic Acid (MULTIVITAMIN/FLUORIDE PO) Take 0.25 mg by mouth daily.    Marland Kitchen. rOPINIRole (REQUIP) 1 MG tablet Take 2 tablets by mouth daily.     . ARIPiprazole (ABILIFY) 5 MG tablet Total of 12.5 mg (10 mg +2.5 mg) daily 15 tablet 1   No current facility-administered medications for this visit.     Neurologic: Headache: No Seizure: No Paresthesias: No  Musculoskeletal: Strength & Muscle Tone: within normal limits Gait & Station: normal Patient leans: N/A  Psychiatric Specialty Exam: Review of Systems  Psychiatric/Behavioral: Negative for depression, hallucinations, memory loss, substance abuse and suicidal ideas. The patient is not nervous/anxious and  does not have insomnia.   All other systems reviewed and are negative.   Blood pressure (!) 158/68, pulse 66, height 5' 5.5" (1.664 m), weight 127 lb (57.6 kg).Body mass index is 20.81 kg/m.  General Appearance: Fairly Groomed  Eye Contact:  Good  Speech:  Clear and Coherent  Volume:  Normal  Mood:  "fine"  Affect:  Congruent and slightly restricted, no change  Thought Process:  Coherent, occasionally disorganized when she tries to explain her paranoia  Orientation:  Full (Time, Place, and Person)  Thought Content: Paranoid Ideation Perceptions: denies AH/VH   Suicidal Thoughts:  No  Homicidal Thoughts:  No  Memory:  Immediate;   Good Recent;   Good Remote;   Good  Judgement:  Good  Insight:  Fair  Psychomotor Activity:  Normal  Concentration:  Concentration: Good and Attention Span: Good  Recall:  Good  Fund of Knowledge: Good  Language: Good  Akathisia:  No  Handed:  Right  AIMS (if indicated):  No tremors, mild rigidity on her left arm  Assets:  Communication Skills Desire for Improvement  ADL's:  Intact  Cognition: WNL  Sleep:  good  MOCA 22/30 on 06/23/2016 (-1 for attention, -1 for language, -5 or delayed recall, -1 for orientation "April 8th")  Assessment Ariel Archer is a 76 y.o. year old female with a history of delusional disorder, hypertension, restless leg, lumbar degenerative disc disease , who presents for follow up appointment for Delusional disorder, persecutory type, first episode, currently in acute episode [facility](HCC)  # Delusional disorder Although patient continues to report paranoia, which has become less and she demonstrates less rumination on these ego dystonic thoughts. Will continue the same dose of Abilify (she could not get higher dose) to target paranoia. Will monitor mild rigidity on her left arm, which may be secondary to Abilify. Will consider switching to other medication or adding benztropine at the next encounter if no improvement in symptoms. Will  discontinue ativan given no symptoms of anxiety.   # Mild neurocognitive disorder She does have mild cognitive deficits as evidenced in MOCA. ADL/IADL independent. Will continue to monitor. Head MRI, labs with no significant abnormality to suggest treatable cause of dementia.   Plan  1. Continue Abilify 12.5 daily (10 mg + 2.5 mg) 2. Return to clinic in six weeks for 30 mins  The patient demonstrates the following risk factors for suicide: Chronic risk factors for suicide include: psychiatric disorder of paranoia. Acute risk factorsfor suicide include: unemployment and recent discharge from inpatient psychiatry. Protective factorsfor this patient include: positive social support, coping skills and hope for the future. Considering these factors, the overall suicide risk at this point appears to be low. Patient isappropriate for outpatient follow up.  Treatment Plan Summary:Plan as above  The duration of this appointment visit was 30 minutes of face-to-face time with  the patient.  Greater than 50% of this time was spent in counseling, explanation of  diagnosis, planning of further management, and coordination of care.  Neysa Hotter, MD 10/18/2016, 2:35 PM

## 2016-10-12 NOTE — Telephone Encounter (Signed)
Yes with diagnosis of delusional disorder

## 2016-10-12 NOTE — Telephone Encounter (Signed)
Per Dr. Vanetta ShawlHisada;Yes with diagnosis of delusional disorder

## 2016-10-12 NOTE — Telephone Encounter (Signed)
Received denial of drug coverage for aripiprazole 2mg  because the use is not supported by the FDA or by one of the Medicare approved references for treating your medical condition:Delusional disorders, unspecified psychosis not due to a substance or known physiological condition, paranoia, ego dystonia paranoia. Coverage requires that the drug has been recoginized for the treatment of your medical condition by one of the following Medicare compendia: 1. [facility] Formulary Service Drug Information 2. [employer] information system

## 2016-10-14 NOTE — Telephone Encounter (Signed)
Per Karie MainlandAli, The diagnosis of Delusional disorder is the one that was used and denied

## 2016-10-14 NOTE — Telephone Encounter (Signed)
The diagnosis of Delusional disorder is the one that was used and denied.

## 2016-10-15 NOTE — Telephone Encounter (Signed)
She had tried perphenazine, which resulted in drowsiness. She needs to be on antipsychotics which has less sedative/less metabolic (as she has hypertension) side effect. Any way we can appeal with this diagnosis?

## 2016-10-18 ENCOUNTER — Ambulatory Visit (INDEPENDENT_AMBULATORY_CARE_PROVIDER_SITE_OTHER): Payer: Medicare Other | Admitting: Psychiatry

## 2016-10-18 VITALS — BP 158/68 | HR 66 | Ht 65.5 in | Wt 127.0 lb

## 2016-10-18 DIAGNOSIS — F22 Delusional disorders: Secondary | ICD-10-CM | POA: Diagnosis not present

## 2016-10-18 DIAGNOSIS — F09 Unspecified mental disorder due to known physiological condition: Secondary | ICD-10-CM | POA: Diagnosis not present

## 2016-10-18 MED ORDER — ARIPIPRAZOLE 10 MG PO TABS: ORAL_TABLET | ORAL | 1 refills | Status: DC

## 2016-10-18 MED ORDER — ARIPIPRAZOLE 5 MG PO TABS: ORAL_TABLET | ORAL | 1 refills | Status: DC

## 2016-10-18 NOTE — Telephone Encounter (Signed)
Per Dr. Vanetta ShawlHisada, She had tried perphenazine, which resulted in drowsiness. She needs to be on antipsychotics which has less sedative/less metabolic (as she has hypertension) side effect. Any way we can appeal with this diagnosis?

## 2016-10-18 NOTE — Patient Instructions (Addendum)
1. Continue Abilify 12.5 daily 2. Return to clinic in six weeks for 30 mins

## 2016-10-20 ENCOUNTER — Telehealth (HOSPITAL_COMMUNITY): Payer: Self-pay | Admitting: *Deleted

## 2016-10-20 NOTE — Telephone Encounter (Signed)
[facility]. They will call office with decision.

## 2016-10-20 NOTE — Telephone Encounter (Signed)
Called for appeal of Abilify. [facility] who states a decision will be made and called to office staff.

## 2016-10-21 NOTE — Telephone Encounter (Signed)
Noted  

## 2016-10-21 NOTE — Telephone Encounter (Signed)
noted 

## 2016-10-22 ENCOUNTER — Telehealth (HOSPITAL_COMMUNITY): Payer: Self-pay | Admitting: *Deleted

## 2016-10-22 NOTE — Telephone Encounter (Signed)
[facility] staff Trey PaulaJeff called to speak with Dr. Vanetta ShawlHisada about pt medication. They are calling about a denial for pt medication that provider prescribed. Per Trey PaulaJeff, his number is [phone] and ext [phone]. Per Trey PaulaJeff he would like for provider to call him before the day ends. Per Trey PaulaJeff pt medication is about the Abilify.

## 2016-10-22 NOTE — Telephone Encounter (Signed)
He was not available and left a voice message. May try next week.

## 2016-10-25 NOTE — Telephone Encounter (Signed)
Left voice message again to contact the office.

## 2016-10-27 ENCOUNTER — Telehealth (HOSPITAL_COMMUNITY): Payer: Self-pay | Admitting: *Deleted

## 2016-10-27 NOTE — Telephone Encounter (Signed)
Pt medication was approved by her insurance.

## 2016-10-27 NOTE — Telephone Encounter (Signed)
Pt insurance called and lm with approval for pt medication that was recently needing a PA. Approval number is [id number] from July 9th until 03-14-17.

## 2016-11-16 ENCOUNTER — Telehealth (HOSPITAL_COMMUNITY): Payer: Self-pay | Admitting: *Deleted

## 2016-11-16 NOTE — Telephone Encounter (Signed)
[place] [facility] [place] request for pt Lorazepam 1 mg QHS PRN. Per pt chart, pt medication was last filled on 07-26-2016 with 30 tabs 0 refill. Per pt chart, pt saw provider on 10-18-16 but only received Abilify refills. Per pt chart, the last chart note that provider mentioned to pt about continuing his Lorazepam PRN was 08-26-2016. Per pt chart, medication is still in current list of meds. Pt f/u appt is 11-29-2016. Pt pharmacy number is [phone].

## 2016-11-16 NOTE — Telephone Encounter (Signed)
Just fill enough to last until appt

## 2016-11-19 ENCOUNTER — Telehealth (HOSPITAL_COMMUNITY): Payer: Self-pay | Admitting: *Deleted

## 2016-11-19 NOTE — Telephone Encounter (Signed)
Opened in Error.

## 2016-11-19 NOTE — Telephone Encounter (Signed)
Called pt to verify if she needs more refills. Staff was not able to reach pt and lmtcb and office number was provided and office hours was provided.

## 2016-11-22 ENCOUNTER — Telehealth (HOSPITAL_COMMUNITY): Payer: Self-pay | Admitting: *Deleted

## 2016-11-22 MED ORDER — LORAZEPAM 1 MG PO TABS: 1.0000 mg | ORAL_TABLET | Freq: Every evening | ORAL | 0 refills | Status: DC | PRN

## 2016-11-22 NOTE — Telephone Encounter (Signed)
Medication called into pt pharmacy and spoke with Caryn BeeKevin

## 2016-11-22 NOTE — Telephone Encounter (Signed)
Per previous note, provider approved staff to call in pt Lorazepam 1 mg QHS PRN. Called pt pharmacy and spoke with Caryn BeeKevin and 30 tabs 0 refill due to pt will have to pay for 30 tabs supply even if its only for a couple days. Per pharmacy it will be cost affective if pt received 30 days instead 7 days worth. Staff called in 30 days and message will be sent to provider.

## 2016-11-22 NOTE — Telephone Encounter (Signed)
I believe that patient has not taken that medication for a while. Asks patient if she would like to have a refill (if that is already ordered, it is fine as well)

## 2016-11-23 ENCOUNTER — Telehealth (HOSPITAL_COMMUNITY): Payer: Self-pay | Admitting: *Deleted

## 2016-11-23 NOTE — Telephone Encounter (Signed)
Called pt on home number and was unable to reach pt. Office number provided on voicemail.

## 2016-11-23 NOTE — Telephone Encounter (Signed)
Called pt mobile number on file and pt husband picked up. Spoke with pt husband and he stated still do take her Lorazepam but she takes it PRN. Per pt husband they picked up medication yesterday. Staff informed provider verbally with information and provider verbalized understanding.

## 2016-11-23 NOTE — Telephone Encounter (Signed)
Received fax from [employer] stating that appeal of Aripiprazole 2mg  has been approved until 03/14/17. Auth #[id number].

## 2016-11-23 NOTE — Telephone Encounter (Signed)
noted 

## 2016-11-25 ENCOUNTER — Telehealth (HOSPITAL_COMMUNITY): Payer: Self-pay | Admitting: *Deleted

## 2016-11-25 NOTE — Telephone Encounter (Signed)
Pt husband called stating pt pharmacy informed him they do not have refills for pt Abilify 10 mg. Per pt chart, pt medication was last filled on 10-18-2016 with 30 tabs 1 refill. Informed pt husband with information and he stated they informed him yesterday they did not have one. Staff called pt pharmacy and spoke with Tammy who stated pt does have Abilify 10 mg 1 refill left. Asked Tammy if they could get medication ready for her. Per Tammy they will get it ready. Staff called pt home and husband mobile and lm on pt husband mobile informing him to go by pt pharmacy and if he needed any moe assistance to feel free to call office back. Office number was provided on husband mobile.

## 2016-11-25 NOTE — Telephone Encounter (Signed)
Perr previous note on file about who staff spoke with when staff called pt pharmacy, the name of who staff spoke with is wrong. This is a correct to previous note. The name staff spoke with was [place].

## 2016-11-29 ENCOUNTER — Ambulatory Visit (HOSPITAL_COMMUNITY): Payer: Medicare Other | Admitting: Psychiatry

## 2016-11-29 NOTE — Progress Notes (Signed)
BH MD/PA/NP OP Progress Note  12/01/2016 2:34 PM Ariel Archer  MRN:  [id number]  Chief Complaint:  Chief Complaint    Follow-up; Paranoid     HPI:  Patient presents for follow up appointment for delusional disorder. She states that she has been anxious and feels tired of preparation of relocation. She feels frustrated that things are not in order. She has not met a neighbor who travels to [place] and denies any concern about them. She denies having any device at home so that they can hear her conversation (which she reported in the previous visits). She denies ideas of reference. She denies AH, VH. She reports occasional insomnia due to think about things needs to be done before moving tomorrow. She denies panic attacks.   Her husband presents to the interview.  She has not mentioned anything about the neighbor. She appears to be anxious as she is "[place]" who needs to do things in order. She seems to be cautions when she walks so that she does not trip.    Per [facility] filled on 07/04/2016 (Staff called in [place] on 9/10)  Wt Readings from Last 3 Encounters:  12/01/16 120 lb 9.6 oz (54.7 kg)  10/18/16 127 lb (57.6 kg)  09/16/16 127 lb (57.6 kg)   Visit Diagnosis:    ICD-10-CM   1. Delusional disorder, persecutory type, first episode, currently in acute episode ([place]) [name]     Past Psychiatric History:  I have reviewed the patient's psychiatry history in detail and updated the patient record. Outpatient: denies Psychiatry admission: [facility] in 3/23-3/25/2018 for paranoia and AH.  Previous suicide attempt: denies Past trials of medication: perphenazine History of violence: denies  Past Medical History: No past medical history on file. No past surgical history on file.  Family Psychiatric History:  I have reviewed the patient's family history in detail and updated the patient record.  Family History: No family history on file.  Social History:  Social History    Social History  . Marital status: Married    Spouse name: N/A  . Number of children: N/A  . Years of education: N/A   Social History Main Topics  . Smoking status: Never Smoker  . Smokeless tobacco: Never Used  . Alcohol use No     Comment: 06-09-2016 per pt rarely  . Drug use: No  . Sexual activity: Not Asked   Other Topics Concern  . None   Social History Narrative  . None    Allergies: No Known Allergies  Metabolic Disorder Labs: Lab Results  Component Value Date   HGBA1C 5.4 06/10/2016   MPG 108 06/10/2016   No results found for: PROLACTIN Lab Results  Component Value Date   CHOL 192 06/10/2016   TRIG 106 06/10/2016   HDL 57 06/10/2016   CHOLHDL 3.4 06/10/2016   VLDL 21 06/10/2016   LDLCALC 114 (H) 06/10/2016   Lab Results  Component Value Date   TSH 1.00 06/10/2016    Therapeutic Level Labs: No results found for: LITHIUM No results found for: VALPROATE No components found for:  CBMZ  Current Medications: Current Outpatient Prescriptions  Medication Sig Dispense Refill  . ARIPiprazole (ABILIFY) 10 MG tablet Take 1 tablet (10 mg total) by mouth daily. 30 tablet 1  . L-METHYLFOLATE CALCIUM PO Take 1 tablet by mouth daily.    Marland Kitchen lisinopril (PRINIVIL,ZESTRIL) 40 MG tablet Take 40 mg by mouth daily.  3  . LORazepam (ATIVAN) 1 MG tablet Take 1  tablet (1 mg total) by mouth at bedtime as needed. 30 tablet 0  . meloxicam (MOBIC) 15 MG tablet Take 1 tablet by mouth daily.    Marland Kitchen Multi Vit-Fluoride-Folic Acid (MULTIVITAMIN/FLUORIDE PO) Take 0.25 mg by mouth daily.    Marland Kitchen rOPINIRole (REQUIP) 1 MG tablet Take 2 tablets by mouth daily.      No current facility-administered medications for this visit.      Musculoskeletal: Strength & Muscle Tone: within normal limits Gait & Station: normal Patient leans: N/A  Psychiatric Specialty Exam: Review of Systems  Psychiatric/Behavioral: Negative for depression, hallucinations, substance abuse and suicidal ideas. The  patient is nervous/anxious and has insomnia.   All other systems reviewed and are negative.   Blood pressure (!) 144/68, pulse 69, height 5' 5.5" (1.664 m), weight 120 lb 9.6 oz (54.7 kg).Body mass index is 19.76 kg/m.  General Appearance: Fairly Groomed  Eye Contact:  Good  Speech:  Clear and Coherent  Volume:  Normal  Mood:  Anxious  Affect:  Appropriate, Restricted and Tearful  Thought Process:  Coherent and Goal Directed  Orientation:  Full (Time, Place, and Person)  Thought Content: Logical Perceptions: denies AH/VH  Suicidal Thoughts:  No  Homicidal Thoughts:  No  Memory:  Immediate;   Good Recent;   Good Remote;   Good  Judgement:  Fair  Insight:  Fair  Psychomotor Activity:  Normal  Concentration:  Concentration: Good and Attention Span: Good  Recall:  Good  Fund of Knowledge: Good  Language: Good  Akathisia:  No  Handed:  Right  AIMS (if indicated): not done. Mild cognitive rigidity on bilateral arms, no resting tremors.   Assets:  Communication Skills Desire for Improvement  ADL's:  Intact  Cognition: WNL  Sleep:  Fair  MOCA 22/30 on 06/23/2016 (-1 for attention, -1 for language, -5 or delayed recall, -1 for orientation "April 8th")   Assessment and Plan:  Ariel Archer is a 76 y.o. year old female with a history of delusional disorder, hypertension, restless leg, lumbar degenerative disc disease, who presents for follow up appointment for Delusional disorder, persecutory type, first episode, currently in acute episode [facility])  # Delusional disorder She has not reported any delusion since the last appointment. Although it is preferable to continue the same dose given her history of rumination on paranoia, will taper down given concern for parkinsonian like symptoms (cognitive rigidity, difficulty with walking per husband). May consider adding benztropine at the next encounter if no improvement in symptoms.   # Adjustment disorder with anxiety  Patient endorses  significant anxiety in the setting of relocation. Offered validation. She has ativan prn for anxiety.   # mild neurocognitive disorder She does have mild cognitive deficits as evidence in [place]. ADL/IADL independent. Head MRI, labs with no abnormality to suggest treatable cause of dementia. Will continue to monitor.   Plan  1. Decrease Abilify 10 mg at night  2. Continue ativan 1 mg as needed for anxiety 3 Return to clinic in two months for 30 mins  The patient demonstrates the following risk factors for suicide: Chronic risk factors for suicide include: psychiatric disorder of paranoia. Acute risk factorsfor suicide include: unemployment and recent discharge from inpatient psychiatry. Protective factorsfor this patient include: positive social support, coping skills and hope for the future. Considering these factors, the overall suicide risk at this point appears to be low. Patient isappropriate for outpatient follow up.  The duration of this appointment visit was 30 minutes of face-to-face time  with the patient.  Greater than 50% of this time was spent in counseling, explanation of  diagnosis, planning of further management, and coordination of care.  Norman Clay, MD 12/01/2016, 2:34 PM

## 2016-12-01 ENCOUNTER — Ambulatory Visit (INDEPENDENT_AMBULATORY_CARE_PROVIDER_SITE_OTHER): Payer: Medicare Other | Admitting: Psychiatry

## 2016-12-01 ENCOUNTER — Encounter (HOSPITAL_COMMUNITY): Payer: Self-pay | Admitting: Psychiatry

## 2016-12-01 VITALS — BP 144/68 | HR 69 | Ht 65.5 in | Wt 120.6 lb

## 2016-12-01 DIAGNOSIS — F22 Delusional disorders: Secondary | ICD-10-CM | POA: Diagnosis not present

## 2016-12-01 DIAGNOSIS — G2581 Restless legs syndrome: Secondary | ICD-10-CM

## 2016-12-01 DIAGNOSIS — G4700 Insomnia, unspecified: Secondary | ICD-10-CM

## 2016-12-01 DIAGNOSIS — G3184 Mild cognitive impairment, so stated: Secondary | ICD-10-CM | POA: Diagnosis not present

## 2016-12-01 DIAGNOSIS — M5136 Other intervertebral disc degeneration, lumbar region: Secondary | ICD-10-CM

## 2016-12-01 DIAGNOSIS — I10 Essential (primary) hypertension: Secondary | ICD-10-CM | POA: Diagnosis not present

## 2016-12-01 DIAGNOSIS — F4322 Adjustment disorder with anxiety: Secondary | ICD-10-CM

## 2016-12-01 DIAGNOSIS — Z79899 Other long term (current) drug therapy: Secondary | ICD-10-CM

## 2016-12-01 MED ORDER — ARIPIPRAZOLE 5 MG PO TABS: ORAL_TABLET | ORAL | 1 refills | Status: DC

## 2016-12-01 MED ORDER — ARIPIPRAZOLE 10 MG PO TABS: ORAL_TABLET | ORAL | 1 refills | Status: DC

## 2016-12-01 MED ORDER — ARIPIPRAZOLE 10 MG PO TABS: 10.0000 mg | ORAL_TABLET | Freq: Every day | ORAL | 1 refills | Status: DC

## 2016-12-01 NOTE — Patient Instructions (Addendum)
1. Decrease abilify 10 mg at night  2. Continue ativan 1 mg as needed for anxiety 3 Return to clinic in two months for 30 mins

## 2016-12-14 ENCOUNTER — Ambulatory Visit (HOSPITAL_COMMUNITY): Payer: Medicare Other | Admitting: Psychiatry

## 2017-01-26 NOTE — Progress Notes (Signed)
BH MD/PA/NP OP Progress Note  02/01/2017 1:26 PM Ariel Archer  MRN:  [id number]  Chief Complaint:  Chief Complaint    Other; Anxiety; Follow-up; Paranoid     HPI:  Patient checked in 15 mins late for follow up appointment for delusional disorder. She states that she is doing well. She feels "it passed" when she is asked about moving. When she is asked about her regular day, she becomes tearful stating that she has not been able to do things as she used to. When she is asked to elaborate, she states that she used to "busy" doing "all kinds of things." She feels worthless not doing anything. Her husband cooks and she does not cook anymore, although she wants to (her husband at the interview later tell her that he did not know she wanted to cook). She is planning to sign up for Gym with her husband. She feels dizzy in the morning. She does not think about her neighbor anymore. She denies paranoia or ideas of reference. She has insomnia at times. She takes ativan twice a week or less for anxiety. She denies panic attacks. She has fair appetite. She denies SI.  Her husband presents to the interview.  He states that she does not have paranoia. They are trying to find out things they can do; they do not have yard or flowers as their old place. Her husband is concerned about her dizziness and would like to decrease the dose of Abilify.    Wt Readings from Last 3 Encounters:  02/01/17 119 lb (54 kg)  12/01/16 120 lb 9.6 oz (54.7 kg)  10/18/16 127 lb (57.6 kg)   Per PMP,  Ativan filled on  11/22/2016  I have utilized the [place] Controlled Substances Reporting System (PMP AWARxE) to confirm adherence regarding the patient's medication. My review reveals appropriate prescription fills.   Visit Diagnosis: No diagnosis found.  Past Psychiatric History:  I have reviewed the patient's psychiatry history in detail and updated the patient record. Outpatient: denies Psychiatry admission: [facility] in  3/23-3/25/2018 for paranoia and AH.  Previous suicide attempt: denies Past trials of medication: perphenazine History of violence: denies  Past Medical History: No past medical history on file. No past surgical history on file.  Family Psychiatric History:  I have reviewed the patient's family history in detail and updated the patient record.  Family History: No family history on file.  Social History:  Social History   Socioeconomic History  . Marital status: Married    Spouse name: None  . Number of children: None  . Years of education: None  . Highest education level: None  Social Needs  . Financial resource strain: None  . Food insecurity - worry: None  . Food insecurity - inability: None  . Transportation needs - medical: None  . Transportation needs - non-medical: None  Occupational History  . None  Tobacco Use  . Smoking status: Never Smoker  . Smokeless tobacco: Never Used  Substance and Sexual Activity  . Alcohol use: No    Comment: 06-09-2016 per pt rarely  . Drug use: No  . Sexual activity: None  Other Topics Concern  . None  Social History Narrative  . None    Allergies: No Known Allergies  Metabolic Disorder Labs: Lab Results  Component Value Date   HGBA1C 5.4 06/10/2016   MPG 108 06/10/2016   No results found for: PROLACTIN Lab Results  Component Value Date   CHOL 192 06/10/2016  TRIG 106 06/10/2016   HDL 57 06/10/2016   CHOLHDL 3.4 06/10/2016   VLDL 21 06/10/2016   LDLCALC 114 (H) 06/10/2016   Lab Results  Component Value Date   TSH 1.00 06/10/2016    Therapeutic Level Labs: No results found for: LITHIUM No results found for: VALPROATE No components found for:  CBMZ  Current Medications: Current Outpatient Medications  Medication Sig Dispense Refill  . ARIPiprazole (ABILIFY) 10 MG tablet Take 1 tablet (10 mg total) by mouth daily. 30 tablet 1  . L-METHYLFOLATE CALCIUM PO Take 1 tablet by mouth daily.    Marland Kitchen. lisinopril  (PRINIVIL,ZESTRIL) 40 MG tablet Take 40 mg by mouth daily.  3  . LORazepam (ATIVAN) 1 MG tablet Take 1 tablet (1 mg total) by mouth at bedtime as needed. 30 tablet 0  . meloxicam (MOBIC) 15 MG tablet Take 1 tablet by mouth daily.    Marland Kitchen. Multi Vit-Fluoride-Folic Acid (MULTIVITAMIN/FLUORIDE PO) Take 0.25 mg by mouth daily.    Marland Kitchen. rOPINIRole (REQUIP) 1 MG tablet Take 2 tablets by mouth daily.      No current facility-administered medications for this visit.      Musculoskeletal: Strength & Muscle Tone: within normal limits Gait & Station: normal Patient leans: N/A  Psychiatric Specialty Exam: Review of Systems  Neurological: Positive for dizziness.  Psychiatric/Behavioral: Negative for depression, hallucinations, substance abuse and suicidal ideas. The patient is nervous/anxious and has insomnia.   All other systems reviewed and are negative.   Blood pressure 138/74, pulse 78, height 5' 5.5" (1.664 m), weight 119 lb (54 kg), SpO2 98 %.Body mass index is 19.5 kg/m.  General Appearance: Fairly Groomed  Eye Contact:  Good  Speech:  Clear and Coherent  Volume:  Normal  Mood:  Anxious  Affect:  Appropriate, Congruent, Restricted, Tearful and down, somewhat fearful at times  Thought Process:  Coherent and Goal Directed  Orientation:  Full (Time, Place, and Person) except date  Thought Content: Rumination Perceptions: denies AH/VH  Suicidal Thoughts:  No  Homicidal Thoughts:  No  Memory:  Immediate;   Good Recent;   Good Remote;   Good  Judgement:  Good  Insight:  Fair  Psychomotor Activity:  Normal  Concentration:  Concentration: Good and Attention Span: Good  Recall:  Good  Fund of Knowledge: Good  Language: Good  Akathisia:  No  Handed:  Right  AIMS (if indicated): not done. Cogwheel rigidity on L>R arm  Assets:  Communication Skills Desire for Improvement  ADL's:  Intact  Cognition: WNL  Sleep:  Poor   Screenings: MOCA 22/30 on 06/23/2016 (-1 for attention, -1 for language,  -5 or delayed recall, -1 for orientation "April 8th")  Assessment and Plan:  Ariel Archer is a 76 y.o. year old female with a history of delusional disorder, hypertension, restless leg, lumbar degenerative disc disease,, who presents for follow up appointment for No diagnosis found.  # Delusional disorder She denies any paranoia since the last appointment. Will taper down Abilify given reported dizziness and cogwheel rigidity on arms. She is advised to contact the office if any worsening in her symptoms.   # Adjustment disorder with anxiety  Exam is notable for tearfulness and patient endorses anxiety with demoralization. She appears to ruminate on things she "used to do," although she is not able to be elaborate it. Discussed behavioral activation. Will consider adding antidepressant if any worsening in symptoms.   # Mild neurocognitive disorder She does have mild cognitive deficits as evidenced in  [facility].  ADL/IADL independent.  Head MRI, labs with no abnormality to suggest treatable cause of dementia.  Will continue to monitor.    Plan  1. Decrease Abilify 5 mg at night  2. Continue ativan 1 mg as needed for anxiety  3. Return to clinic in two months for 30 mins  The patient demonstrates the following risk factors for suicide: Chronic risk factors for suicide include: psychiatric disorder of paranoia. Acute risk factorsfor suicide include: unemployment and recent discharge from inpatient psychiatry. Protective factorsfor this patient include: positive social support, coping skills and hope for the future. Considering these factors, the overall suicide risk at this point appears to be low. Patient isappropriate for outpatient follow up.  The duration of this appointment visit was 30 minutes of face-to-face time with the patient.  Greater than 50% of this time was spent in counseling, explanation of  diagnosis, planning of further management, and coordination of care.  Neysa Hottereina Wileen Duncanson,  MD 02/01/2017, 1:26 PM

## 2017-02-01 ENCOUNTER — Encounter (HOSPITAL_COMMUNITY): Payer: Self-pay | Admitting: Psychiatry

## 2017-02-01 ENCOUNTER — Ambulatory Visit (HOSPITAL_COMMUNITY): Payer: Medicare Other | Admitting: Psychiatry

## 2017-02-01 VITALS — BP 138/74 | HR 78 | Ht 65.5 in | Wt 119.0 lb

## 2017-02-01 DIAGNOSIS — I10 Essential (primary) hypertension: Secondary | ICD-10-CM | POA: Diagnosis not present

## 2017-02-01 DIAGNOSIS — F4322 Adjustment disorder with anxiety: Secondary | ICD-10-CM

## 2017-02-01 DIAGNOSIS — M5136 Other intervertebral disc degeneration, lumbar region: Secondary | ICD-10-CM

## 2017-02-01 DIAGNOSIS — Z79899 Other long term (current) drug therapy: Secondary | ICD-10-CM

## 2017-02-01 DIAGNOSIS — G2581 Restless legs syndrome: Secondary | ICD-10-CM | POA: Diagnosis not present

## 2017-02-01 DIAGNOSIS — G3184 Mild cognitive impairment, so stated: Secondary | ICD-10-CM | POA: Diagnosis not present

## 2017-02-01 DIAGNOSIS — F22 Delusional disorders: Secondary | ICD-10-CM

## 2017-02-01 MED ORDER — ARIPIPRAZOLE 5 MG PO TABS: 5.0000 mg | ORAL_TABLET | Freq: Every day | ORAL | 1 refills | Status: DC

## 2017-02-01 MED ORDER — LORAZEPAM 1 MG PO TABS: 1.0000 mg | ORAL_TABLET | Freq: Every evening | ORAL | 0 refills | Status: DC | PRN

## 2017-02-01 NOTE — Patient Instructions (Signed)
1. Decrease Abilify 5 mg at night  2. Continue ativan 1 mg as needed for anxiety  3. Return to clinic in two months for 30 mins

## 2017-04-05 NOTE — Progress Notes (Deleted)
BH MD/PA/NP OP Progress Note  04/05/2017 12:04 PM Ariel Archer  MRN:  [id number]  Chief Complaint:  HPI: *** Visit Diagnosis: No diagnosis found.  Past Psychiatric History:  I have reviewed the patient's psychiatry history in detail and updated the patient record. Outpatient: denies Psychiatry admission: [facility] in 3/23-3/25/2018 for paranoia and AH.  Previous suicide attempt: denies Past trials of medication: perphenazine History of violence: denies   Past Medical History: No past medical history on file. No past surgical history on file.  Family Psychiatric History:  I have reviewed the patient's family history in detail and updated the patient record.  Family History: No family history on file.  Social History:  Social History   Socioeconomic History  . Marital status: Married    Spouse name: Not on file  . Number of children: Not on file  . Years of education: Not on file  . Highest education level: Not on file  Social Needs  . Financial resource strain: Not on file  . Food insecurity - worry: Not on file  . Food insecurity - inability: Not on file  . Transportation needs - medical: Not on file  . Transportation needs - non-medical: Not on file  Occupational History  . Not on file  Tobacco Use  . Smoking status: Never Smoker  . Smokeless tobacco: Never Used  Substance and Sexual Activity  . Alcohol use: No    Comment: 06-09-2016 per pt rarely  . Drug use: No  . Sexual activity: Not on file  Other Topics Concern  . Not on file  Social History Narrative  . Not on file    Allergies: No Known Allergies  Metabolic Disorder Labs: Lab Results  Component Value Date   HGBA1C 5.4 06/10/2016   MPG 108 06/10/2016   No results found for: PROLACTIN Lab Results  Component Value Date   CHOL 192 06/10/2016   TRIG 106 06/10/2016   HDL 57 06/10/2016   CHOLHDL 3.4 06/10/2016   VLDL 21 06/10/2016   LDLCALC 114 (H) 06/10/2016   Lab Results  Component Value  Date   TSH 1.00 06/10/2016    Therapeutic Level Labs: No results found for: LITHIUM No results found for: VALPROATE No components found for:  CBMZ  Current Medications: Current Outpatient Medications  Medication Sig Dispense Refill  . ARIPiprazole (ABILIFY) 5 MG tablet Take 1 tablet (5 mg total) by mouth daily. 30 tablet 1  . L-METHYLFOLATE CALCIUM PO Take 1 tablet by mouth daily.    Marland Kitchen. lisinopril (PRINIVIL,ZESTRIL) 40 MG tablet Take 40 mg by mouth daily.  3  . LORazepam (ATIVAN) 1 MG tablet Take 1 tablet (1 mg total) by mouth at bedtime as needed. 30 tablet 0  . meloxicam (MOBIC) 15 MG tablet Take 1 tablet by mouth daily.    Marland Kitchen. Multi Vit-Fluoride-Folic Acid (MULTIVITAMIN/FLUORIDE PO) Take 0.25 mg by mouth daily.    Marland Kitchen. rOPINIRole (REQUIP) 1 MG tablet Take 2 tablets by mouth daily.      No current facility-administered medications for this visit.      Musculoskeletal: Strength & Muscle Tone: within normal limits Gait & Station: normal Patient leans: N/A  Psychiatric Specialty Exam: ROS  There were no vitals taken for this visit.There is no height or weight on file to calculate BMI.  General Appearance: Fairly Groomed  Eye Contact:  Good  Speech:  Clear and Coherent  Volume:  Normal  Mood:  {BHH MOOD:22306}  Affect:  {Affect (PAA):22687}  Thought Process:  Coherent  Orientation:  Full (Time, Place, and Person)  Thought Content: {Thought Content:22690}   Suicidal Thoughts:  {ST/HT (PAA):22692}  Homicidal Thoughts:  {ST/HT (PAA):22692}  Memory:  Immediate;   Fair  Judgement:  Good  Insight:  {Insight (PAA):22695}  Psychomotor Activity:  Normal  Concentration:  Concentration: Good and Attention Span: Good  Recall:  Good  Fund of Knowledge: Good  Language: Good  Akathisia:  No  Handed:  Right  AIMS (if indicated): not done  Assets:  Communication Skills Desire for Improvement  ADL's:  Intact  Cognition: WNL  Sleep:  {BHH GOOD/FAIR/POOR:22877}   Screenings: MOCA  22/30 on 06/23/2016 (-1 for attention, -1 for language, -5 or delayed recall, -1 for orientation "April 8th")  Assessment and Plan:  Ariel Archer is a 77 y.o. year old female with a history of delusional disorder, hypertension, anxiety, estless leg, lumbar degenerative discdisease, who presents for follow up appointment for No diagnosis found.  # Delusional disorder  She denies any paranoia since the last appointment. Will taper down Abilify given reported dizziness and cogwheel rigidity on arms. She is advised to contact the office if any worsening in her symptoms.   # Adjustment disorder with anxiety  Exam is notable for tearfulness and patient endorses anxiety with demoralization. She appears to ruminate on things she "used to do," although she is not able to be elaborate it. Discussed behavioral activation. Will consider adding antidepressant if any worsening in symptoms.   # Mild neurocognitive disorder She does have mild cognitive deficits as evidenced in [facility].  ADL/IADL independent.  Head MRI, labs with no abnormality to suggest treatable cause of dementia.  Will continue to monitor.    Plan 1. Decrease Abilify 5 mg at night  2. Continue ativan 1 mg as needed for anxiety  3. Return to clinic in two months for 30 mins  The patient demonstrates the following risk factors for suicide: Chronic risk factors for suicide include: psychiatric disorder of paranoia. Acute risk factorsfor suicide include: unemployment and recent discharge from inpatient psychiatry. Protective factorsfor this patient include: positive social support, coping skills and hope for the future. Considering these factors, the overall suicide risk at this point appears to be low. Patient isappropriate for outpatient follow up.       Neysa Hotter, MD 04/05/2017, 12:04 PM

## 2017-04-06 ENCOUNTER — Ambulatory Visit (HOSPITAL_COMMUNITY): Payer: Medicare Other | Admitting: Psychiatry

## 2017-04-06 ENCOUNTER — Other Ambulatory Visit (HOSPITAL_COMMUNITY): Payer: Self-pay | Admitting: Psychiatry

## 2017-04-06 MED ORDER — ARIPIPRAZOLE 5 MG PO TABS: 5.0000 mg | ORAL_TABLET | Freq: Every day | ORAL | 0 refills | Status: DC

## 2017-04-11 NOTE — Progress Notes (Signed)
BH MD/PA/NP OP Progress Note  04/14/2017 3:18 PM Ariel Archer  MRN:  [id number]  Some of the contents are copied from another note created today due to technical issues (unable to sign the other note).   Chief Complaint:  Chief Complaint    Follow-up; Other     HPI:  Patient presents for follow-up appointment for delusional disorder.  She states that she has been doing well.  She denies any paranoia or delusion.  When she is asked about neighbor, she states that "people are mean." When she is asked to elaborate it, she states that her neighbor used to say things to her, although she denies hearing it recently. She denies AH, VH. She has been going to gym with her husband. She enjoys playing cards when she is in the house. She likes new house. She enjoyed the visit of her son who lives in [place].  She denies insomnia.  She denies feeling fatigue.  She denies feeling depressed.  She denies SI, HI.  She denies anxiety or panic attacks.  She denies any dizziness since the last appointment.  She reports that she has been eating well, although she has weight loss.   Visit Diagnosis:    ICD-10-CM   1. 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   Past Medical History: No past medical history on file. No past surgical history on file.  Family Psychiatric History: I have reviewed the patient's family history in detail and updated the patient record.  Family History: No family history on file.  Social History:  Social History   Socioeconomic History  . Marital status: Married    Spouse name: None  . Number of children: None  . Years of education: None  . Highest education level: None  Social Needs  . Financial resource strain: None  . Food insecurity - worry: None  . Food insecurity - inability: None  . Transportation  needs - medical: None  . Transportation needs - non-medical: None  Occupational History  . None  Tobacco Use  . Smoking status: Never Smoker  . Smokeless tobacco: Never Used  Substance and Sexual Activity  . Alcohol use: No    Comment: 06-09-2016 per pt rarely  . Drug use: No  . Sexual activity: None  Other Topics Concern  . None  Social History Narrative  . None    Allergies: No Known Allergies  Metabolic Disorder Labs: Lab Results  Component Value Date   HGBA1C 5.4 06/10/2016   MPG 108 06/10/2016   No results found for: PROLACTIN Lab Results  Component Value Date   CHOL 192 06/10/2016   TRIG 106 06/10/2016   HDL 57 06/10/2016   CHOLHDL 3.4 06/10/2016   VLDL 21 06/10/2016   LDLCALC 114 (H) 06/10/2016   Lab Results  Component Value Date   TSH 1.00 06/10/2016    Therapeutic Level Labs: No results found for: LITHIUM No results found for: VALPROATE No components found for:  CBMZ  Current Medications: Current Outpatient Medications  Medication Sig Dispense Refill  . ARIPiprazole (ABILIFY) 2 MG tablet Take 1 tablet (2 mg total) by mouth daily. 90 tablet 0  . L-METHYLFOLATE CALCIUM PO Take 1 tablet by mouth daily.    Marland Kitchen lisinopril (PRINIVIL,ZESTRIL) 40 MG tablet Take 40 mg by mouth daily.  3  . LORazepam (ATIVAN) 1  MG tablet Take 1 tablet (1 mg total) by mouth at bedtime as needed. 30 tablet 0  . meloxicam (MOBIC) 15 MG tablet Take 1 tablet by mouth daily.    Marland Kitchen. Multi Vit-Fluoride-Folic Acid (MULTIVITAMIN/FLUORIDE PO) Take 0.25 mg by mouth daily.    Marland Kitchen. rOPINIRole (REQUIP) 1 MG tablet Take 2 tablets by mouth daily.      No current facility-administered medications for this visit.      Musculoskeletal: Strength & Muscle Tone: within normal limits Gait & Station: normal Patient leans: N/A  Psychiatric Specialty Exam: ROS  Blood pressure (!) 167/77, pulse 63, height 5' 5.5" (1.664 m), weight 118 lb (53.5 kg), SpO2 100 %.Body mass index is 19.34 kg/m.  ROS: 12  points negative. No dizziness. No headache, no chest pain. Psych ROS; see above.   General Appearance: Fairly Groomed  Eye Contact:  Good  Speech:  Clear and Coherent  Volume:  Normal  Mood:  "fine"  Affect:  Appropriate, Congruent and slightly restricted  Thought Process:  Coherent  Orientation:  Full (Time, Place, and Person) except "February, early week"  Thought Content: Logical   Suicidal Thoughts:  No  Homicidal Thoughts:  No  Memory:  Immediate;   Fair  Judgement:  Good  Insight:  Fair  Psychomotor Activity:  Normal  Concentration:  Concentration: Good and Attention Span: Good  Recall:  Good  Fund of Knowledge: Good  Language: Good  Akathisia:  No  Handed:  Right  AIMS (if indicated): not done. Mild cogwheel rigidity on bilateral arms  Assets:  Communication Skills Desire for Improvement  ADL's:  Intact  Cognition: WNL  Sleep:  Good   Screenings: MOCA 22/30 on 06/23/2016 (-1 for attention, -1 for language, -5 or delayed recall, -1 for orientation "April 8th")  Assessment and Plan:  Ariel PolingRosel Archer is a 77 y.o. year old female with a history of delusional disorder, hypertension, restless leg, lumbar degenerative discdisease , who presents for follow up appointment for No diagnosis found.  # Delusional disorder There has been improvement in her delusion and she denies any paranoia since the last appointment.  Will taper down Abilify given cogwheel rigidity on arms.  Patient is reminded to be back on the higher dose if she started to have delusion  # Adjustment disorder with anxiety  There has been improvement in anxiety and patient demonstrates no rumination on today's evaluation.  Will continue to monitor.  Discussed behavioral activation.   # Mild neurocognitive disorder She does have mild cognitive deficits as evidenced a Moca.  ADL/IADL independent.  Head MRI, labs with no abnormality to suggest treatable cause of dementia.  Will plan to do another Moca at the  next visit.   Plan 1. Decrease Abilify 2 mg at night  2. Continue ativan 1 mg as needed for anxiety  3.Return to clinic inthree months for 30 mins 4. Discuss with your primary care about weight loss   The patient demonstrates the following risk factors for suicide: Chronic risk factors for suicide include: psychiatric disorder of paranoia. Acute risk factorsfor suicide include: unemployment and recent discharge from inpatient psychiatry. Protective factorsfor this patient include: positive social support, coping skills and hope for the future. Considering these factors, the overall suicide risk at this point appears to be low. Patient isappropriate for outpatient follow up.  The duration of this appointment visit was 30minutes of face-to-face time with the patient. Greater than 50% of this time was spent in counseling, explanation of  diagnosis, planning  of further management, and coordination of care.

## 2017-04-14 ENCOUNTER — Encounter (HOSPITAL_COMMUNITY): Payer: Self-pay | Admitting: Psychiatry

## 2017-04-14 ENCOUNTER — Ambulatory Visit (INDEPENDENT_AMBULATORY_CARE_PROVIDER_SITE_OTHER): Payer: Medicare Other | Admitting: Psychiatry

## 2017-04-14 VITALS — BP 167/77 | HR 63 | Ht 65.5 in | Wt 118.0 lb

## 2017-04-14 DIAGNOSIS — G3184 Mild cognitive impairment, so stated: Secondary | ICD-10-CM | POA: Diagnosis not present

## 2017-04-14 DIAGNOSIS — F22 Delusional disorders: Secondary | ICD-10-CM

## 2017-04-14 DIAGNOSIS — Z79899 Other long term (current) drug therapy: Secondary | ICD-10-CM

## 2017-04-14 DIAGNOSIS — F4322 Adjustment disorder with anxiety: Secondary | ICD-10-CM

## 2017-04-14 MED ORDER — ARIPIPRAZOLE 2 MG PO TABS: 2.0000 mg | ORAL_TABLET | Freq: Every day | ORAL | 0 refills | Status: DC

## 2017-04-14 NOTE — Patient Instructions (Addendum)
1. Decrease Abilify 2 mg at night  2. Continue ativan 1 mg as needed for anxiety  3. Return to clinic in three months for 30 mins 4. Discuss with your primary care about weight loss

## 2017-05-09 ENCOUNTER — Other Ambulatory Visit (HOSPITAL_COMMUNITY): Payer: Self-pay | Admitting: Psychiatry

## 2017-05-09 ENCOUNTER — Telehealth (HOSPITAL_COMMUNITY): Payer: Self-pay | Admitting: Psychiatry

## 2017-05-09 MED ORDER — LORAZEPAM 1 MG PO TABS: 1.0000 mg | ORAL_TABLET | Freq: Every evening | ORAL | 1 refills | Status: DC | PRN

## 2017-05-09 NOTE — Telephone Encounter (Signed)
Ordered a month of ativan with a refill.   I have utilized the [place] Controlled Substances Reporting System (PMP AWARxE) to confirm adherence regarding the patient's medication. My review reveals appropriate prescription fills.

## 2017-07-11 NOTE — Progress Notes (Signed)
BH MD/PA/NP OP Progress Note  07/12/2017 1:56 PM Ariel Archer  MRN:  [id number]  Chief Complaint:  Chief Complaint    Paranoid; Follow-up     HPI:  Patient presents for follow-up appointment for delusional disorder. Many of the history if provided by her husband, Ariel Archer.  She denies any paranoia since the last appointment. She does not think about the neighbor anymore. She had a good time on Easter, visiting her daughter in [place].  She denies any anxiety.  She enjoys playing cards. She has initial insomnia. She has good energy. She denies SI. She denies ideas of reference.   Ariel Archer, her husband presents to the interview.  Ariel Archer has been doing very well. She appears to be more relaxed when she started to increase Abilify from 2 mg to 5 mg (got refill two weeks ago). She has not mentioned about neighbor and Ariel Archer denies any concern about paranoia. Ariel Archer thought ativan is sleeping aids; he wishes it to switch to other sleeping as it is not working for her.   Per PMP,  Lorazepam filled on 05/09/2017  Visit Diagnosis:    ICD-10-CM   1. 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 Mild neurocognitive disorder G31.84     Past Psychiatric History:  I have reviewed the patient's psychiatry history in detail and updated the patient record. Outpatient: denies Psychiatry admission: [facility] in 3/23-3/25/2018 for paranoia and AH.  Previous suicide attempt: denies Past trials of medication: perphenazine, lorazepam History of violence: denies  Past Medical History: No past medical history on file. No past surgical history on file.  Family Psychiatric History: I have reviewed the patient's family history in detail and updated the patient record.  Family History: No family history on file.  Social History:  Social History   Socioeconomic History  . Marital status: Married    Spouse name: Not on file  . Number of children: Not on file   . Years of education: Not on file  . Highest education level: Not on file  Occupational History  . Not on file  Social Needs  . Financial resource strain: Not on file  . Food insecurity:    Worry: Not on file    Inability: Not on file  . Transportation needs:    Medical: Not on file    Non-medical: Not on file  Tobacco Use  . Smoking status: Never Smoker  . Smokeless tobacco: Never Used  Substance and Sexual Activity  . Alcohol use: No    Comment: 06-09-2016 per pt rarely  . Drug use: No  . Sexual activity: Not on file  Lifestyle  . Physical activity:    Days per week: Not on file    Minutes per session: Not on file  . Stress: Not on file  Relationships  . Social connections:    Talks on phone: Not on file    Gets together: Not on file    Attends religious service: Not on file    Active member of club or organization: Not on file    Attends meetings of clubs or organizations: Not on file    Relationship status: Not on file  Other Topics Concern  . Not on file  Social History Narrative  . Not on file    Allergies: No Known Allergies  Metabolic Disorder Labs: Lab Results  Component Value Date   HGBA1C 5.4 06/10/2016   MPG 108 06/10/2016   No results found  for: PROLACTIN Lab Results  Component Value Date   CHOL 192 06/10/2016   TRIG 106 06/10/2016   HDL 57 06/10/2016   CHOLHDL 3.4 06/10/2016   VLDL 21 06/10/2016   LDLCALC 114 (H) 06/10/2016   Lab Results  Component Value Date   TSH 1.00 06/10/2016    Therapeutic Level Labs: No results found for: LITHIUM No results found for: VALPROATE No components found for:  CBMZ  Current Medications: Current Outpatient Medications  Medication Sig Dispense Refill  . L-METHYLFOLATE CALCIUM PO Take 1 tablet by mouth daily.    Marland Kitchen lisinopril (PRINIVIL,ZESTRIL) 40 MG tablet Take 40 mg by mouth daily.  3  . meloxicam (MOBIC) 15 MG tablet Take 1 tablet by mouth daily.    Marland Kitchen Multi Vit-Fluoride-Folic Acid  (MULTIVITAMIN/FLUORIDE PO) Take 0.25 mg by mouth daily.    Marland Kitchen rOPINIRole (REQUIP) 1 MG tablet Take 2 tablets by mouth daily.     . traZODone (DESYREL) 50 MG tablet 25-50 mg at night as needed for sleep 90 tablet 0   No current facility-administered medications for this visit.      Musculoskeletal: Strength & Muscle Tone: within normal limits Gait & Station: normal Patient leans: N/A  Psychiatric Specialty Exam: Review of Systems  Psychiatric/Behavioral: Negative for depression, hallucinations, memory loss, substance abuse and suicidal ideas. The patient has insomnia. The patient is not nervous/anxious.   All other systems reviewed and are negative.   Blood pressure (!) 173/69, pulse 64, height  (1.651 m), weight 126 lb (57.2 kg), SpO2 98 %.Body mass index is 20.97 kg/m.  General Appearance: Fairly Groomed  Eye Contact:  Good  Speech:  Clear and Coherent (has slightly increased latency, unable to discern whether it attributes to her hearing)  Volume:  Normal  Mood:  "fine"  Affect:  slightly restricted  Thought Process:  Coherent  Orientation:  Full (Time, Place, and Person)  Thought Content: Logical   Suicidal Thoughts:  No  Homicidal Thoughts:  No  Memory:  Immediate;   Fair  Judgement:  Good  Insight:  Fair  Psychomotor Activity:  Normal  Concentration:  Concentration: Good and Attention Span: Good  Recall:  Good  Fund of Knowledge: Good  Language: Good  Akathisia:  No  Handed:  Right  AIMS (if indicated): not done  Assets:  Communication Skills Desire for Improvement  ADL's:  Intact  Cognition: WNL  Sleep:  Poor   Screenings: MOCA 22/30 on 06/23/2016 (-1 for attention, -1 for language, -5 or delayed recall, -1 for orientation "April 8th")  Assessment and Plan:  Ariel Archer is a 77 y.o. year old female with a history of delusional disorder, hypertension, restless leg,  lumbar degenerative discdisease, who presents for follow up appointment for Delusional  disorder, persecutory type, first episode, currently in acute episode (HCC)  Mild neurocognitive disorder  # Delusional disorder Patient denies paranoia since the last appointment (It was accidentally increased to 5 mg which was likely from the old prescription). Will taper off Abilify given patient denies any paranoia since around September 2018 while on Abilify. Both the patient and her husband agree to restart Abilify and contact the clinic if she starts to have paranoia.   # Adjustment disorder with anxiety She denies significant anxiety since the last appointment. Will continue to monitor.   # Mild neurocognitive disorder She does have mild cognitive deficits. Head MRI, labs with no abnormality so suggest treatable cause of dementia. Will plan to do MOCA at the next visit.    #  insomnia Patient endorses initial insomnia.  Discussed sleep hygiene.  We will start trazodone as needed for insomnia.   Plan 1. Discontinue Abilify  2. Discontinue Ativan,   3. Start Trazodone 25-50 mg at night as needed for sleep  4. Return to clinic in three months for 30 mins  The patient demonstrates the following risk factors for suicide: Chronic risk factors for suicide include: psychiatric disorder of paranoia. Acute risk factorsfor suicide include: unemployment and recent discharge from inpatient psychiatry. Protective factorsfor this patient include: positive social support, coping skills and hope for the future. Considering these factors, the overall suicide risk at this point appears to be low. Patient isappropriate for outpatient follow up.  The duration of this appointment visit was 30 minutes of face-to-face time with the patient.  Greater than 50% of this time was spent in counseling, explanation of  diagnosis, planning of further management, and coordination of care.  Neysa Hotter, MD 07/12/2017, 1:56 PM

## 2017-07-12 ENCOUNTER — Encounter (HOSPITAL_COMMUNITY): Payer: Self-pay | Admitting: Psychiatry

## 2017-07-12 ENCOUNTER — Ambulatory Visit (INDEPENDENT_AMBULATORY_CARE_PROVIDER_SITE_OTHER): Payer: Medicare Other | Admitting: Psychiatry

## 2017-07-12 ENCOUNTER — Ambulatory Visit (HOSPITAL_COMMUNITY): Payer: Medicare Other | Admitting: Psychiatry

## 2017-07-12 VITALS — BP 173/69 | HR 64 | Ht 65.0 in | Wt 126.0 lb

## 2017-07-12 DIAGNOSIS — G3184 Mild cognitive impairment, so stated: Secondary | ICD-10-CM

## 2017-07-12 DIAGNOSIS — F4322 Adjustment disorder with anxiety: Secondary | ICD-10-CM | POA: Diagnosis not present

## 2017-07-12 DIAGNOSIS — G4700 Insomnia, unspecified: Secondary | ICD-10-CM

## 2017-07-12 DIAGNOSIS — F22 Delusional disorders: Secondary | ICD-10-CM | POA: Diagnosis not present

## 2017-07-12 MED ORDER — TRAZODONE HCL 50 MG PO TABS: ORAL_TABLET | ORAL | 0 refills | Status: DC

## 2017-07-12 NOTE — Patient Instructions (Signed)
1. Disconitnue Abilify  2. Discontinue Ativan,   3. Continue Trazodone 25-50 mg at night as needed for sleep  4. Return to clinic in three months for 30 mins

## 2017-10-01 DIAGNOSIS — Z125 Encounter for screening for malignant neoplasm of prostate: Secondary | ICD-10-CM | POA: Insufficient documentation

## 2017-10-03 DIAGNOSIS — M8589 Other specified disorders of bone density and structure, multiple sites: Secondary | ICD-10-CM | POA: Insufficient documentation

## 2017-10-03 HISTORY — DX: Other specified disorders of bone density and structure, multiple sites: M85.89

## 2017-10-05 NOTE — Progress Notes (Signed)
BH MD/PA/NP OP Progress Note  10/11/2017 3:26 PM Ariel Archer  MRN:  [id number]  Chief Complaint:  Chief Complaint    Follow-up; Other     HPI:  Patient presents for follow up appointment for delusional disorder. The majority of history is provided from her husband.  She denies any concern about her memory, although she has some difficulty in recall at times. She does not think about her neighbor anymore. She denies any paranoia. She endorses insomnia and takes trazodone. She denies feeling depressed or anxious. She denies SI. She denies AH, VH.   Per her husband, the patient discontinued Abilify a few weeks ago as they thought it was directed that way. Her husband states that things are going very good. He denies any concern of paranoia.   Functional Status Instrumental Activities of Daily Living (IADLs):  Ariel Archer is independent in the following:cooking Requires assistance with the following:  managing finances, medication  Activities of Daily Living (ADLs):  Ariel Archer is independent in the following: bathing and hygiene, feeding, continence, grooming and toileting, walking  Visit Diagnosis:    ICD-10-CM   1. 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 I have reviewed the history. No updates at this time.     Past Medical History: No past medical history on file. No past surgical history on file.  Family Psychiatric History: Please see initial evaluation for full details. I have reviewed the history. No updates at this time.     Family History: No family history on file.  Social History:  Social History   Socioeconomic History  . Marital status: Married    Spouse name: Not on file  . Number of children: Not on file  . Years of education: Not on file  . Highest education level: Not on file   Occupational History  . Not on file  Social Needs  . Financial resource strain: Not on file  . Food insecurity:    Worry: Not on file    Inability: Not on file  . Transportation needs:    Medical: Not on file    Non-medical: Not on file  Tobacco Use  . Smoking status: Never Smoker  . Smokeless tobacco: Never Used  Substance and Sexual Activity  . Alcohol use: No    Comment: 06-09-2016 per pt rarely  . Drug use: No  . Sexual activity: Not on file  Lifestyle  . Physical activity:    Days per week: Not on file    Minutes per session: Not on file  . Stress: Not on file  Relationships  . Social connections:    Talks on phone: Not on file    Gets together: Not on file    Attends religious service: Not on file    Active member of club or organization: Not on file    Attends meetings of clubs or organizations: Not on file    Relationship status: Not on file  Other Topics Concern  . Not on file  Social History Narrative  . Not on file    Allergies: No Known Allergies  Metabolic Disorder Labs: Lab Results  Component Value Date   HGBA1C 5.4 06/10/2016   MPG 108 06/10/2016   No results found for: PROLACTIN Lab Results  Component Value Date   CHOL 192 06/10/2016   TRIG 106 06/10/2016  HDL 57 06/10/2016   CHOLHDL 3.4 06/10/2016   VLDL 21 06/10/2016   LDLCALC 114 (H) 06/10/2016   Lab Results  Component Value Date   TSH 1.00 06/10/2016    Therapeutic Level Labs: No results found for: LITHIUM No results found for: VALPROATE No components found for:  CBMZ  Current Medications: Current Outpatient Medications  Medication Sig Dispense Refill  . L-METHYLFOLATE CALCIUM PO Take 1 tablet by mouth daily.    Marland Kitchen lisinopril (PRINIVIL,ZESTRIL) 40 MG tablet Take 40 mg by mouth daily.  3  . meloxicam (MOBIC) 15 MG tablet Take 1 tablet by mouth daily.    Marland Kitchen Multi Vit-Fluoride-Folic Acid (MULTIVITAMIN/FLUORIDE PO) Take 0.25 mg by mouth daily.    Marland Kitchen rOPINIRole (REQUIP) 1 MG tablet  Take 2 tablets by mouth daily.     . traZODone (DESYREL) 50 MG tablet 25-50 mg at night as needed for sleep 90 tablet 1  . donepezil (ARICEPT) 5 MG tablet Take 1 tablet (5 mg total) by mouth at bedtime. 30 tablet 3   No current facility-administered medications for this visit.      Musculoskeletal: Strength & Muscle Tone: within normal limits Gait & Station: normal Patient leans: N/A  Psychiatric Specialty Exam: ROS  Blood pressure (!) 144/61, pulse 61, height 5\' 5"  (1.651 m), weight 128 lb (58.1 kg), SpO2 98 %.Body mass index is 21.3 kg/m.  General Appearance: Fairly Groomed  Eye Contact:  Good  Speech:  Clear and Coherent  Volume:  Normal  Mood:  "good"  Affect:  Appropriate, Congruent and Full Range  Thought Process:  Coherent  Orientation:  Full (Time, Place, and Person)  Thought Content: Logical   Suicidal Thoughts:  No  Homicidal Thoughts:  No  Memory:  Immediate;   Fair  Judgement:  Good  Insight:  Fair  Psychomotor Activity:  Normal  Concentration:  Concentration: Good and Attention Span: Good  Recall:  Good  Fund of Knowledge: Good  Language: Good  Akathisia:  No  Handed:  Right  AIMS (if indicated): no rigidity, no tremors  Assets:  Communication Skills Desire for Improvement  ADL's:  Intact  Cognition: WNL  Sleep:  Poor   Screenings: MOCA 22/30 on 06/23/2016 (-1 for attention, -1 for language, -5 or delayed recall, -1 for orientation "April 8th") [facility] 17/30 on 10/11/17 (-2 for visuospatial, -2 for attention, -2 for language, -1 for abstraction, -5 for delayed recall, -1 for orientation (July 23rd) )  Assessment and Plan:  Ariel Archer is a 77 y.o. 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 Differential include delusional disorder and early manifestation of dementia. Will continue to monitor.   # Mild neurocognitive disorder  # r/o Lewy body dementia the patient does have worsening in cognitive deficits, which is characterized by MOCA. Head MRi, labs with no abnormality to suggest treatable cause of dementia. Given her recent delusion can be related to neurocognitive disorder (r/o Lewy body dementia, Alzheimer disease), will start donepezil for cognitive disorder. Discussed GI side effect.   # Insomnia Patient continues to endorse initial insomnia. Discussed sleep hygiene. Will continue trazodone as needed for insomnia  Plan 1. Start donepezil 5 mg daily 2. Continue Trazodone 25-50 mg at night as needed for sleep  3. Return to clinic in three months for 15 mins  The patient demonstrates the following risk factors for suicide: Chronic risk factors for suicide include: psychiatric disorder of paranoia. Acute risk factorsfor suicide include: unemployment and recent discharge from inpatient psychiatry. Protective factorsfor this patient include: positive social support, coping skills and hope for the future. Considering these factors, the overall suicide risk at this point appears to be low. Patient isappropriate for outpatient follow up.  The duration of this appointment visit was 30 minutes of face-to-face time with the patient.  Greater than 50% of this time was spent in counseling, explanation of  diagnosis, planning of further management, and coordination of care.  Ariel Hottereina Dianelys Scinto, MD 10/11/2017, 3:26 PM

## 2017-10-11 ENCOUNTER — Ambulatory Visit (INDEPENDENT_AMBULATORY_CARE_PROVIDER_SITE_OTHER): Payer: Medicare Other | Admitting: Psychiatry

## 2017-10-11 ENCOUNTER — Encounter (HOSPITAL_COMMUNITY): Payer: Self-pay | Admitting: Psychiatry

## 2017-10-11 VITALS — BP 144/61 | HR 61 | Ht 65.0 in | Wt 128.0 lb

## 2017-10-11 DIAGNOSIS — Z79899 Other long term (current) drug therapy: Secondary | ICD-10-CM | POA: Diagnosis not present

## 2017-10-11 DIAGNOSIS — G4700 Insomnia, unspecified: Secondary | ICD-10-CM | POA: Diagnosis not present

## 2017-10-11 DIAGNOSIS — G3184 Mild cognitive impairment, so stated: Secondary | ICD-10-CM

## 2017-10-11 DIAGNOSIS — F22 Delusional disorders: Secondary | ICD-10-CM | POA: Diagnosis not present

## 2017-10-11 MED ORDER — DONEPEZIL HCL 5 MG PO TABS: 5.0000 mg | ORAL_TABLET | Freq: Every day | ORAL | 3 refills | Status: DC

## 2017-10-11 MED ORDER — TRAZODONE HCL 50 MG PO TABS: ORAL_TABLET | ORAL | 1 refills | Status: DC

## 2017-10-11 NOTE — Patient Instructions (Signed)
1. Start donepezil 5 mg daily 2. Continue Trazodone 25-50 mg at night as needed for sleep  3. Return to clinic in three months for 15 mins

## 2018-02-07 NOTE — Progress Notes (Signed)
BH MD/PA/NP OP Progress Note  02/13/2018 1:23 PM Ariel Archer  MRN:  [id number]  Chief Complaint:  Chief Complaint    Follow-up; Other; Paranoid     HPI:  Patient presents for follow up appointment for cognitive disorder, paranoia. She states that she has dry mouth, and needs to swallow her saliva so that she will not spit it out. She has difficulty in talking due to this. She is unsure how long she has this symptoms. She invited her daughter for holiday gathering, and she had a good time. She takes a walk every day. She enjoys doing puzzles. She denies any paranoia. She denies insomnia. She denies feeling depressed, or anxious. She denies SI, HI, AH, VH.   Her husband presents to the interview.  He denies any concern, and she is almost back to herself. No paranoia as if nothing happened. She takes Aricept 2.5 mg daily as higher dose caused her GI side effect.    Wt Readings from Last 3 Encounters:  02/13/18 127 lb (57.6 kg)  10/11/17 128 lb (58.1 kg)  07/12/17 126 lb (57.2 kg)     Visit Diagnosis:    ICD-10-CM   1. 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 I have reviewed the history. No updates at this time.     Past Medical History: No past medical history on file. No past surgical history on file.  Family Psychiatric History: Please see initial evaluation for full details. I have reviewed the history. No updates at this time.     Family History: No family history on file.  Social History:  Social History   Socioeconomic History  . Marital status: Married    Spouse name: Not on file  . Number of children: Not on file  . Years of education: Not on file  . Highest education level: Not on file  Occupational History  . Not on file  Social Needs  . Financial resource strain: Not on file  . Food insecurity:    Worry: Not on file    Inability: Not on file  . Transportation needs:    Medical:  Not on file    Non-medical: Not on file  Tobacco Use  . Smoking status: Never Smoker  . Smokeless tobacco: Never Used  Substance and Sexual Activity  . Alcohol use: No    Comment: 06-09-2016 per pt rarely  . Drug use: No  . Sexual activity: Not on file  Lifestyle  . Physical activity:    Days per week: Not on file    Minutes per session: Not on file  . Stress: Not on file  Relationships  . Social connections:    Talks on phone: Not on file    Gets together: Not on file    Attends religious service: Not on file    Active member of club or organization: Not on file    Attends meetings of clubs or organizations: Not on file    Relationship status: Not on file  Other Topics Concern  . Not on file  Social History Narrative  . Not on file    Allergies: No Known Allergies  Metabolic Disorder Labs: Lab Results  Component Value Date   HGBA1C 5.4 06/10/2016   MPG 108 06/10/2016   No results found for: PROLACTIN Lab Results  Component Value Date   CHOL 192 06/10/2016   TRIG 106 06/10/2016   HDL 57 06/10/2016   CHOLHDL  3.4 06/10/2016   VLDL 21 06/10/2016   LDLCALC 114 (H) 06/10/2016   Lab Results  Component Value Date   TSH 1.00 06/10/2016    Therapeutic Level Labs: No results found for: LITHIUM No results found for: VALPROATE No components found for:  CBMZ  Current Medications: Current Outpatient Medications  Medication Sig Dispense Refill  . donepezil (ARICEPT) 5 MG tablet Take 1 tablet (5 mg total) by mouth at bedtime. 30 tablet 3  . L-METHYLFOLATE CALCIUM PO Take 1 tablet by mouth daily.    Marland Kitchen lisinopril (PRINIVIL,ZESTRIL) 40 MG tablet Take 40 mg by mouth daily.  3  . meloxicam (MOBIC) 15 MG tablet Take 1 tablet by mouth daily.    Marland Kitchen Multi Vit-Fluoride-Folic Acid (MULTIVITAMIN/FLUORIDE PO) Take 0.25 mg by mouth daily.    Marland Kitchen rOPINIRole (REQUIP) 1 MG tablet Take 2 tablets by mouth daily.     . traZODone (DESYREL) 50 MG tablet 25-50 mg at night as needed for sleep  90 tablet 1   No current facility-administered medications for this visit.      Musculoskeletal: Strength & Muscle Tone: within normal limits Gait & Station: normal Patient leans: N/A  Psychiatric Specialty Exam: Review of Systems  All other systems reviewed and are negative.   Blood pressure (!) 158/72, pulse 70, height 5\' 5"  (1.651 m), weight 127 lb (57.6 kg), SpO2 98 %.Body mass index is 21.13 kg/m.  General Appearance: Fairly Groomed  Eye Contact:  Good  Speech:  Garbled  Volume:  Normal  Mood:  "good"  Affect:  Appropriate, Congruent and slightly restricted, but calm  Thought Process:  Coherent  Orientation:  Full (Time, Place, and Person)  Thought Content: Logical   Suicidal Thoughts:  No  Homicidal Thoughts:  No  Memory:  Immediate;   Good  Judgement:  Good  Insight:  Fair  Psychomotor Activity:  Normal  Concentration:  Concentration: Good and Attention Span: Good  Recall:  Fair  Fund of Knowledge: Good  Language: Good  Akathisia:  No  Handed:  Right  AIMS (if indicated): no rigidity  Assets:  Communication Skills Desire for Improvement  ADL's:  Intact  Cognition: WNL  Sleep:  Good   Screenings: MOCA 22/30 on 06/23/2016 (-1 for attention, -1 for language, -5 or delayed recall, -1 for orientation "April 8th") Ariel Archer 17/30 on 10/11/17 (-2 for visuospatial, -2 for attention, -2 for language, -1 for abstraction, -5 for delayed recall, -1 for orientation (July 23rd) )  Assessment and Plan:  Ariel Archer is a 77 y.o. year old female with a history of delusional disorder, hypertension, restless leg,lumbar degenerative discdisease , who presents for follow up appointment for Mild neurocognitive disorder  #  Mild neurocognitive disorder  # r/o Lewy body dementia Exam is notable for garbled speech, which she attributes to dry mouth. The patient could not tolerate donepezil due to GI side effect. She also has dry mouth, which coincided with starting donepezil. Will  discontinue this medication to see if it improves her symptoms. Head MRi, labs with no abnormality to suggest treatable cause of dementia.   # Delusion- resolved # r/o delusional disorder # # r/o Lewy body dementia Patient denies paranoia, and she was able to be off Abilify without relapse in her symptoms. Differential includes delusional disorder, and early manifestation of dementia. Will continue to monitor.   # Insomnia Improving. Will continue trazodone prn for insomnia. Discussed sleep hygiene.   Plan 1. Discontinue donepezil (used to take 2.5 mg. GI side  effect from 5 mg)- she will restart this medication if that does not alleviate her dry mouth 2. 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 Acute risk factorsfor suicide include: unemployment and recent discharge from inpatient psychiatry. Protective factorsfor this patient include: positive social support, coping skills and hope for the future. Considering these factors, the overall suicide risk at this point appears to be low. Patient isappropriate for outpatient follow up.   Ariel Hottereina Vivi Piccirilli, MD 02/13/2018, 1:23 PM

## 2018-02-13 ENCOUNTER — Encounter (HOSPITAL_COMMUNITY): Payer: Self-pay | Admitting: Psychiatry

## 2018-02-13 ENCOUNTER — Ambulatory Visit (INDEPENDENT_AMBULATORY_CARE_PROVIDER_SITE_OTHER): Payer: Medicare Other | Admitting: Psychiatry

## 2018-02-13 VITALS — BP 158/72 | HR 70 | Ht 65.0 in | Wt 127.0 lb

## 2018-02-13 DIAGNOSIS — G3184 Mild cognitive impairment, so stated: Secondary | ICD-10-CM

## 2018-02-13 NOTE — Patient Instructions (Addendum)
1. Discontinue donepezil. If it does not improve your dry mouth, restart 2.5 mg daily (and contact the office if you need any refill) 2. Continue Trazodone 25-50 mg at night as needed for sleep  3.Return to clinic in six months for 15 mins

## 2018-03-07 ENCOUNTER — Other Ambulatory Visit (HOSPITAL_COMMUNITY): Payer: Self-pay | Admitting: Psychiatry

## 2018-03-07 MED ORDER — ARIPIPRAZOLE 5 MG PO TABS: ORAL_TABLET | ORAL | 0 refills | Status: DC

## 2018-03-07 NOTE — Telephone Encounter (Signed)
Discussed with her husband. Delaila started to say "bizarre things" for the last few weeks.  She stated that she could not find the money.  On another time, she woke up her husband at night, stating that they need to turn the screen off as "they are coming."  She tried to look outside and look for women who she thought were coming to their house.  She also reports concern that her daughter steals their money.  Although there was one time she stated that she wanted to hang herself, there is no concern of actual self harm.  Her husband denies any imminent safety concern nor any violence. He is not aware of any patient physical complaints including weakness, numbness, fever. She has good appetite, and denies insomnia.  Advised the following: - Start Abilify 2.5 mg at night, then 5 mg at night to target delusion - He would bring the patient to the nearest ED if symptoms worsens despite the treatment, and/or if there are any safety concern.   - Will have follow up at 8:45 on December 27.

## 2018-03-10 ENCOUNTER — Ambulatory Visit (HOSPITAL_COMMUNITY): Payer: Medicare Other | Admitting: Psychiatry

## 2018-03-13 ENCOUNTER — Telehealth (HOSPITAL_COMMUNITY): Payer: Self-pay | Admitting: *Deleted

## 2018-03-13 NOTE — Telephone Encounter (Signed)
Sorry that I did not even know that they arrived here on Friday. I am glad to hear that she is doing better. Could you ask her if she is interested in seen by [place] at [place]? If so, contact the [place] office to make an appointment with me in January. If not, will reach out to the [place] office for transfer.

## 2018-03-13 NOTE — Telephone Encounter (Signed)
Dr Paulita CradleHisda Patient husband called  &  Requested a transfer/ referral [facility] a [facility]. OR any Psychiatry Office in [place]. They came down for there appointment on Friday  Was told they had to reschedule because they were a few minutes late. So he rather go [place] somewhere [place]. He stated Mrs Gardiner CoinsBerish is doing better & not hearing any voices @ this time.

## 2018-04-05 ENCOUNTER — Other Ambulatory Visit (HOSPITAL_COMMUNITY): Payer: Self-pay | Admitting: Psychiatry

## 2018-04-05 MED ORDER — ARIPIPRAZOLE 5 MG PO TABS: 5.0000 mg | ORAL_TABLET | Freq: Every day | ORAL | 0 refills | Status: DC

## 2018-04-06 NOTE — Progress Notes (Signed)
BH MD/PA/NP OP Progress Note  04/08/2018 2:35 PM Ariel Archer  MRN:  [id number]  Chief Complaint:  Chief Complaint    Follow-up     HPI:  Abilify was reinitiated due to patient having paranoia since the last visit.   Phone visit on 1/24:  "Discussed with her husband. Braylei started to say "bizarre things" for the last few weeks.  She stated that she could not find the money.  On another time, she woke up her husband at night, stating that they need to turn the screen off as "they are coming."  She tried to look outside and look for women who she thought were coming to their house.  She also reports concern that her daughter steals their money.  Although there was one time she stated that she wanted to hang herself, there is no concern of actual self harm.  Her husband denies any imminent safety concern nor any violence. He is not aware of any patient physical complaints including weakness, numbness, fever. She has good appetite, and denies insomnia."  [name] states that she has been doing good.  She does not recall any paranoia her husband mentioned.  She had good holidays.  Her daughter was with them.  She denies any paranoia or any concern about her family.  She denies feeling depressed or anxiety.  She has fair sleep.  She denies SI. She admits having some memory loss, while she is unable to elaborate it.   Her husband presents to the interview.  He states that her paranoia disappeared the day she started Abilify.  In addition to what he said on the phone, she was talking about her friend in [place], who was initially planned to visit them.  Although it was canceled, the patient insisted that the friend will be coming.  She also talks about other friends, who she found in the parking lot; her husband did not see anybody.  The patient has been forgetful and misplaces money. He denies safety concern.     Wt Readings from Last 3 Encounters:  04/08/18 124 lb 12.8 oz (56.6 kg)  02/13/18 127 lb  (57.6 kg)  10/11/17 128 lb (58.1 kg)    Visit Diagnosis:    ICD-10-CM   1. 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 Paranoia (HCC) F22      Past Psychiatric History: Please see initial evaluation for full details. I have reviewed the history. No updates at this time.     Past Medical History: History reviewed. No pertinent past medical history. History reviewed. No pertinent surgical history.  Family Psychiatric History: Please see initial evaluation for full details. I have reviewed the history. No updates at this time.     Family History: History reviewed. No pertinent family history.  Social History:  Social History   Socioeconomic History  . Marital status: Married    Spouse name: Not on file  . Number of children: 2  . Years of education: Not on file  . Highest education level: Some college, no degree  Occupational History  . Not on file  Social Needs  . Financial resource strain: Not hard at all  . Food insecurity:    Worry: Never true    Inability: Never true  . Transportation needs:    Medical: No    Non-medical: No  Tobacco Use  . Smoking status: Never Smoker  . Smokeless tobacco: Never Used  Substance and Sexual Activity  . Alcohol use: No  Comment: 06-09-2016 per pt rarely  . Drug use: No  . Sexual activity: Not Currently  Lifestyle  . Physical activity:    Days per week: 2 days    Minutes per session: 30 min  . Stress: Not at all  Relationships  . Social connections:    Talks on phone: Not on file    Gets together: Not on file    Attends religious service: Never    Active member of club or organization: Yes    Attends meetings of clubs or organizations: More than 4 times per year    Relationship status: Married  Other Topics Concern  . Not on file  Social History Narrative  . Not on file    Allergies: No Known Allergies  Metabolic Disorder Labs: Lab Results  Component Value Date   HGBA1C 5.4  06/10/2016   MPG 108 06/10/2016   No results found for: PROLACTIN Lab Results  Component Value Date   CHOL 192 06/10/2016   TRIG 106 06/10/2016   HDL 57 06/10/2016   CHOLHDL 3.4 06/10/2016   VLDL 21 06/10/2016   LDLCALC 114 (H) 06/10/2016   Lab Results  Component Value Date   TSH 1.00 06/10/2016    Therapeutic Level Labs: No results found for: LITHIUM No results found for: VALPROATE No components found for:  CBMZ  Current Medications: Current Outpatient Medications  Medication Sig Dispense Refill  . ARIPiprazole (ABILIFY) 5 MG tablet Take 1 tablet (5 mg total) by mouth daily. 30 tablet 0  . L-METHYLFOLATE CALCIUM PO Take 1 tablet by mouth daily.    Marland Kitchen. lisinopril (PRINIVIL,ZESTRIL) 40 MG tablet Take 40 mg by mouth daily.  3  . Multi Vit-Fluoride-Folic Acid (MULTIVITAMIN/FLUORIDE PO) Take 0.25 mg by mouth daily.    Marland Kitchen. rOPINIRole (REQUIP) 1 MG tablet Take 2 tablets by mouth daily.     . traZODone (DESYREL) 50 MG tablet 25-50 mg at night as needed for sleep 90 tablet 1  . [START ON 06/07/2018] ARIPiprazole (ABILIFY) 5 MG tablet Take 0.5 tablets (2.5 mg total) by mouth daily. 45 tablet 0  . rivastigmine (EXELON) 1.5 MG capsule Take 1 capsule (1.5 mg total) by mouth 2 (two) times daily. 60 capsule 2   No current facility-administered medications for this visit.      Musculoskeletal: Strength & Muscle Tone: within normal limits Gait & Station: normal Patient leans: N/A  Psychiatric Specialty Exam: Review of Systems  Psychiatric/Behavioral: Positive for memory loss. Negative for depression, hallucinations, substance abuse and suicidal ideas. The patient is not nervous/anxious and does not have insomnia.   All other systems reviewed and are negative.   Blood pressure (!) 146/73, pulse 76, temperature 98.8 F (37.1 C), temperature source Oral, weight 124 lb 12.8 oz (56.6 kg).Body mass index is 20.77 kg/m.  General Appearance: Fairly Groomed  Eye Contact:  Good  Speech:  Clear  and Coherent  Volume:  Normal  Mood:  "good"  Affect:  Appropriate, Congruent and calm  Thought Process:  Coherent  Orientation:  Full (Time, Place, and Person)  Thought Content: Logical   Suicidal Thoughts:  No  Homicidal Thoughts:  No  Memory:  Immediate;   Good  Judgement:  Good  Insight:  Present  Psychomotor Activity:  Normal  Concentration:  Concentration: Good and Attention Span: Good  Recall:  Good  Fund of Knowledge: Good  Language: Good  Akathisia:  No  Handed:  Right  AIMS (if indicated): no tremors, no rigidity  Assets:  Communication  Skills Desire for Improvement  ADL's:  Intact  Cognition: WNL  Sleep:  Fair   Screenings: MOCA 22/30 on 06/23/2016 (-1 for attention, -1 for language, -5 or delayed recall, -1 for orientation "April 8th") MOCA17/30 on07/30/19(-2 for visuospatial, -2 for attention, -2 for language, -1 for abstraction, -5 for delayed recall, -1 for orientation (July 23rd) )  Assessment and Plan:  Jackelyn PolingRosel Wygant is a 78 y.o. year old female with a history of mild neurocognitive disorder, delusion,  hypertension, restless leg,lumbar degenerative discdisease , who presents for follow up appointment for Mild neurocognitive disorder - Plan: Ambulatory referral to Psychology  Paranoia [facility] : [facility](HCC)  # mild neurocognitive disorder # Delusion- resolved # r/o Lewy body dementia Patient recently had episodes of delusion, which has resolved in the context of reinitiating Abilify.  She has had fluctuated symptoms of paranoia and cognitive deficits, which can be attributable to Lewy body dementia. Provided psycho education of dementia, and recommended mental exercise.  Will make referral to neuropsychological testing to delineate her symptoms. Head MRi, labs with no abnormality to suggest treatable cause of dementia. Will start rivastigmine to target dementia.  Discussed potential GI side effect.  Will continue Abilify at this time to target paranoia.  Discussed potential  metabolic side effect and EPS.   Plan 1. Start Rivastigmine 1.5 mg twice a day  2. Continue Abilify 2.5 mg daily  3. Return to clinic in three months for 30 mins 4. Referral to neuropsychological testing    The patient demonstrates the following risk factors for suicide: Chronic risk factors for suicide include: psychiatric disorder of paranoia. Acute risk factorsfor suicide include: unemployment and recent discharge from inpatient psychiatry. Protective factorsfor this patient include: positive social support, coping skills and hope for the future. Considering these factors, the overall suicide risk at this point appears to be low. Patient isappropriate for outpatient follow up.  The duration of this appointment visit was 25 minutes of face-to-face time with the patient.  Greater than 50% of this time was spent in counseling, explanation of  diagnosis, planning of further management, and coordination of care.  Neysa Hottereina Jyasia Markoff, MD 04/08/2018, 2:35 PM

## 2018-04-08 ENCOUNTER — Other Ambulatory Visit: Payer: Self-pay

## 2018-04-08 ENCOUNTER — Ambulatory Visit (INDEPENDENT_AMBULATORY_CARE_PROVIDER_SITE_OTHER): Payer: Medicare Other | Admitting: Psychiatry

## 2018-04-08 ENCOUNTER — Encounter (HOSPITAL_COMMUNITY): Payer: Self-pay | Admitting: Psychiatry

## 2018-04-08 VITALS — BP 146/73 | HR 76 | Temp 98.8°F | Ht 65.0 in | Wt 124.8 lb

## 2018-04-08 DIAGNOSIS — F22 Delusional disorders: Secondary | ICD-10-CM

## 2018-04-08 DIAGNOSIS — G3184 Mild cognitive impairment, so stated: Secondary | ICD-10-CM

## 2018-04-08 MED ORDER — ARIPIPRAZOLE 5 MG PO TABS: 2.5000 mg | ORAL_TABLET | Freq: Every day | ORAL | 0 refills | Status: DC

## 2018-04-08 MED ORDER — RIVASTIGMINE TARTRATE 1.5 MG PO CAPS: 1.5000 mg | ORAL_CAPSULE | Freq: Two times a day (BID) | ORAL | 2 refills | Status: DC

## 2018-04-08 NOTE — Patient Instructions (Signed)
1. Start Rivastigmine 1.5 mg twice a day  2. Continue Abilify 2.5 mg daily  3. Return to clinic in three months for 30 mins 4. Referral to neuropsychological testing

## 2018-06-28 NOTE — Progress Notes (Signed)
Virtual Visit via Telephone Note  I connected with Ariel Archer on 06/29/18 at 10:30 AM EDT by telephone and verified that I am speaking with the correct person using two identifiers.   I discussed the limitations, risks, security and privacy concerns of performing an evaluation and management service by telephone and the availability of in person appointments. I also discussed with the patient that there may be a patient responsible charge related to this service. The patient expressed understanding and agreed to proceed.   I discussed the assessment and treatment plan with the patient. The patient was provided an opportunity to ask questions and all were answered. The patient agreed with the plan and demonstrated an understanding of the instructions.   The patient was advised to call back or seek an in-person evaluation if the symptoms worsen or if the condition fails to improve as anticipated.  I provided 15 minutes of non-face-to-face time during this encounter.   Neysa Hotter, MD    [facility] MD/PA/NP OP Progress Note  06/29/2018 10:57 AM Ariel Archer  MRN:  [id number]  Chief Complaint:  Chief Complaint    Other; Follow-up     HPI:  This is a follow-up for cognitive impairment and delusion.  She states that she has been doing well.  Although she has not been able to go outside as much as she wishes, she takes a walk every day.  She also enjoys playing cards with her husband.  Her daughter is doing good.  She denies any paranoia or delusion. She does not have any concern about her neighbor ("they are doing their way).  She denies AH, VH.  She denies feeling depressed.  Denies anxiety.  She denies SI.  She cancelled neuropsychological testing as she does not think she has issues with memory.   Ariel Archer, her husband is on the speaker phone.  He states that she has been doing very well.  They tried to have routine in the day.  He denies any concern of the patient having any delusion.  She sleeps  well and eats well.  She takes medication regularly.  No safety concern.    129 lbs 06/29/18  Wt Readings from Last 3 Encounters:  04/08/18 124 lb 12.8 oz (56.6 kg)  02/13/18 127 lb (57.6 kg)  10/11/17 128 lb (58.1 kg)    Visit Diagnosis:    ICD-10-CM   1. 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 Paranoia (HCC) F22     Past Psychiatric History: Please see initial evaluation for full details. I have reviewed the history. No updates at this time.     Past Medical History: No past medical history on file. No past surgical history on file.  Family Psychiatric History: Please see initial evaluation for full details. I have reviewed the history. No updates at this time.     Family History: No family history on file.  Social History:  Social History   Socioeconomic History  . Marital status: Married    Spouse name: Not on file  . Number of children: 2  . Years of education: Not on file  . Highest education level: Some college, no degree  Occupational History  . Not on file  Social Needs  . Financial resource strain: Not hard at all  . Food insecurity:    Worry: Never true    Inability: Never true  . Transportation needs:    Medical: No    Non-medical: No  Tobacco Use  . Smoking status: Never Smoker  .  Smokeless tobacco: Never Used  Substance and Sexual Activity  . Alcohol use: No    Comment: 06-09-2016 per pt rarely  . Drug use: No  . Sexual activity: Not Currently  Lifestyle  . Physical activity:    Days per week: 2 days    Minutes per session: 30 min  . Stress: Not at all  Relationships  . Social connections:    Talks on phone: Not on file    Gets together: Not on file    Attends religious service: Never    Active member of club or organization: Yes    Attends meetings of clubs or organizations: More than 4 times per year    Relationship status: Married  Other Topics Concern  . Not on file  Social History Narrative  . Not on file    Allergies: No  Known Allergies  Metabolic Disorder Labs: Lab Results  Component Value Date   HGBA1C 5.4 06/10/2016   MPG 108 06/10/2016   No results found for: PROLACTIN Lab Results  Component Value Date   CHOL 192 06/10/2016   TRIG 106 06/10/2016   HDL 57 06/10/2016   CHOLHDL 3.4 06/10/2016   VLDL 21 06/10/2016   LDLCALC 114 (H) 06/10/2016   Lab Results  Component Value Date   TSH 1.00 06/10/2016    Therapeutic Level Labs: No results found for: LITHIUM No results found for: VALPROATE No components found for:  CBMZ  Current Medications: Current Outpatient Medications  Medication Sig Dispense Refill  . ARIPiprazole (ABILIFY) 5 MG tablet Take 1 tablet (5 mg total) by mouth daily. 30 tablet 0  . [START ON 07/07/2018] ARIPiprazole (ABILIFY) 5 MG tablet Take 0.5 tablets (2.5 mg total) by mouth daily. 45 tablet 1  . L-METHYLFOLATE CALCIUM PO Take 1 tablet by mouth daily.    Marland Kitchen lisinopril (PRINIVIL,ZESTRIL) 40 MG tablet Take 40 mg by mouth daily.  3  . Multi Vit-Fluoride-Folic Acid (MULTIVITAMIN/FLUORIDE PO) Take 0.25 mg by mouth daily.    Melene Muller ON 07/08/2018] rivastigmine (EXELON) 1.5 MG capsule Take 1 capsule (1.5 mg total) by mouth 2 (two) times daily. 180 capsule 1  . rOPINIRole (REQUIP) 1 MG tablet Take 2 tablets by mouth daily.     . traZODone (DESYREL) 50 MG tablet 25-50 mg at night as needed for sleep 90 tablet 1   No current facility-administered medications for this visit.      Musculoskeletal: Strength & Muscle Tone: N/A Gait & Station: N/A Patient leans: N/A  Psychiatric Specialty Exam: Review of Systems  Psychiatric/Behavioral: Negative for depression, hallucinations, memory loss, substance abuse and suicidal ideas. The patient is not nervous/anxious and does not have insomnia.   All other systems reviewed and are negative.   There were no vitals taken for this visit.There is no height or weight on file to calculate BMI.  General Appearance: NA  Eye Contact:  NA   Speech:  Clear and Coherent  Volume:  Normal  Mood:  "good"  Affect:  NA  Thought Process:  Coherent  Orientation:  Full (Time, Place, and Person)  Thought Content: Logical   Suicidal Thoughts:  No  Homicidal Thoughts:  No  Memory:  Immediate;   Good  Judgement:  Good  Insight:  Good  Psychomotor Activity:  Normal  Concentration:  Concentration: Good and Attention Span: Good  Recall:  Good  Fund of Knowledge: Fair  Language: Good  Akathisia:  No  Handed:  Right  AIMS (if indicated): not done  Assets:  Desire for Improvement Social Support  ADL's:  Intact  Cognition: WNL  Sleep:  Good   Screenings: MOCA 22/30 on 06/23/2016 (-1 for attention, -1 for language, -5 or delayed recall, -1 for orientation "April 8th") MOCA17/30 on07/30/19(-2 for visuospatial, -2 for attention, -2 for language, -1 for abstraction, -5 for delayed recall, -1 for orientation (July 23rd) )  Assessment and Plan:  Ariel Archer is a 78 y.o. year old female with a history of mild neurocognitive disorder, delusion, hypertension, restless leg,lumbar degenerative discdisease , who presents for follow up appointment for Mild neurocognitive disorder  Paranoia (HCC)  # Mild neurocognitive disorder # Delusion- resolved # r/o Lewy body dementia She has had no delusion after reinitiating Abilify.  Noted that she has had fluctuated symptoms of paranoia and cognitive deficits, which is highly likely to be attributable to Lewy body dementia.  Although she will benefit from neuropsychological testing, she would like to hold this option.  Head MRI, labs with no abnormality to suggest treatable cause of dementia.  Will continue Rivastigmine to target dementia.  Discussed potential GI side effect.  Will continue Abilify to target paranoia.  Discussed potential metabolic side effect and EPS.   Plan I have reviewed and updated plans as below 1. Continue Rivastigmine 1.5 mg twice a day  2. Continue Abilify 2.5 mg  daily  3. Next appointment- 8/29 at 1 PM for 30 mins 4. Referral to neuropsychological testing; patient declined   The patient demonstrates the following risk factors for suicide: Chronic risk factors for suicide include: psychiatric disorder of paranoia. Acute risk factorsfor suicide include: unemployment and recent discharge from inpatient psychiatry. Protective factorsfor this patient include: positive social support, coping skills and hope for the future. Considering these factors, the overall suicide risk at this point appears to be low. Patient isappropriate for outpatient follow up.   Neysa Hottereina Roc Streett, MD 06/29/2018, 10:57 AM

## 2018-06-29 ENCOUNTER — Other Ambulatory Visit: Payer: Self-pay

## 2018-06-29 ENCOUNTER — Ambulatory Visit (INDEPENDENT_AMBULATORY_CARE_PROVIDER_SITE_OTHER): Payer: Medicare Other | Admitting: Psychiatry

## 2018-06-29 ENCOUNTER — Encounter (HOSPITAL_COMMUNITY): Payer: Self-pay | Admitting: Psychiatry

## 2018-06-29 DIAGNOSIS — G3184 Mild cognitive impairment, so stated: Secondary | ICD-10-CM | POA: Diagnosis not present

## 2018-06-29 DIAGNOSIS — F22 Delusional disorders: Secondary | ICD-10-CM | POA: Diagnosis not present

## 2018-06-29 MED ORDER — ARIPIPRAZOLE 5 MG PO TABS: 2.5000 mg | ORAL_TABLET | Freq: Every day | ORAL | 1 refills | Status: DC

## 2018-06-29 MED ORDER — RIVASTIGMINE TARTRATE 1.5 MG PO CAPS: 1.5000 mg | ORAL_CAPSULE | Freq: Two times a day (BID) | ORAL | 1 refills | Status: DC

## 2018-07-01 ENCOUNTER — Ambulatory Visit (HOSPITAL_COMMUNITY): Payer: Medicare Other | Admitting: Psychiatry

## 2018-08-15 ENCOUNTER — Ambulatory Visit (HOSPITAL_COMMUNITY): Payer: Medicare Other | Admitting: Psychiatry

## 2018-11-04 IMAGING — MR MR HEAD W/O CM
9 of 10 series · 42 of 48 positions shown · non-contrast
Comparison: None.

CLINICAL DATA: Progressive cognitive decline with memory loss,
confusion, difficulty processing information. Diplopia and gait
disorder. Symptoms for 6 months. Paranoia. Mild cognitive disorder.

EXAM:
MRI HEAD WITHOUT CONTRAST
TECHNIQUE: Multiplanar, multiecho pulse sequences of the brain and surrounding
structures were obtained without intravenous contrast.

[Series 2: T1 · sagittal · 5.0mm · 0.45mm/px · 3 of 23 slices shown]
[im 1/23]
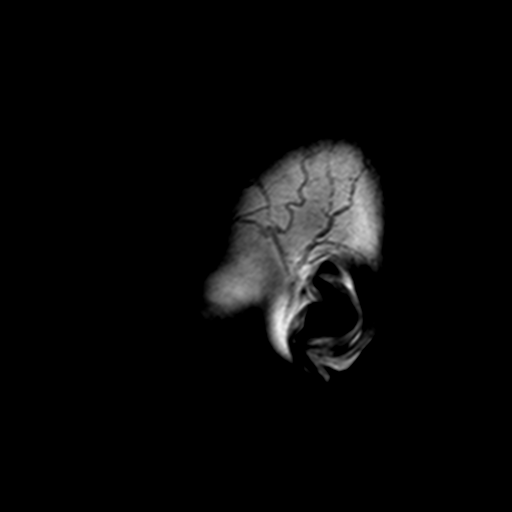
[im 12/23]
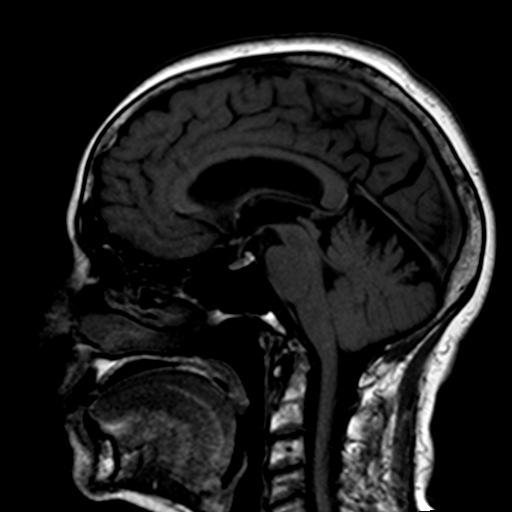
[im 23/23]
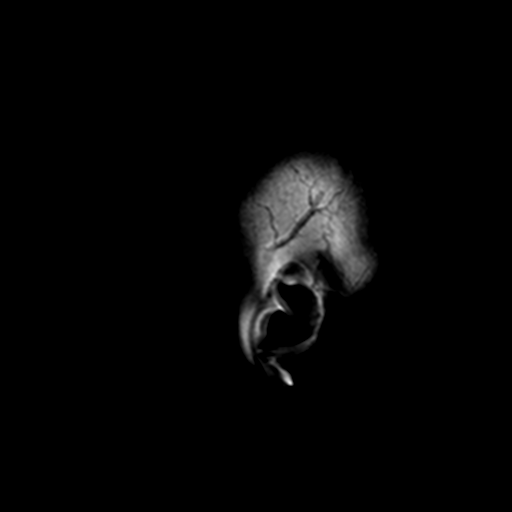

[Series 3: DWI · axial · 3.0mm · 2.19mm/px · z∈[-56,+89]mm · 9 of 89 slices shown (1 of 4)]
[im 1/89]
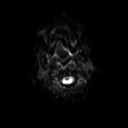
[im 12/89]
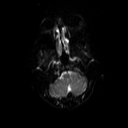
[im 23/89]
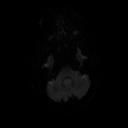
[im 34/89]
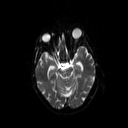
[im 45/89]
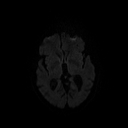
[im 56/89]
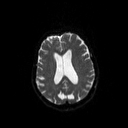
[im 67/89]
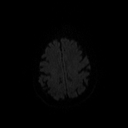
[im 78/89]
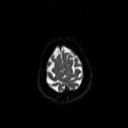
[im 89/89]
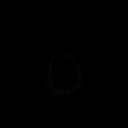

[Series 4: DWI · axial · 3.0mm · 2.19mm/px · z∈[-56,+89]mm · 5 of 45 slices shown (2 of 4)]
[im 1/45]
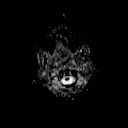
[im 12/45]
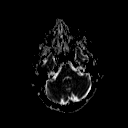
[im 23/45]
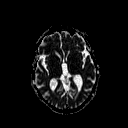
[im 34/45]
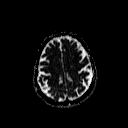
[im 45/45]
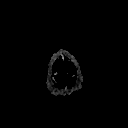

[Series 5: T2 · axial · 5.0mm · 0.45mm/px · z∈[-59,+95]mm · 2 of 23 slices shown (1 of 3)]
[im 1/23]
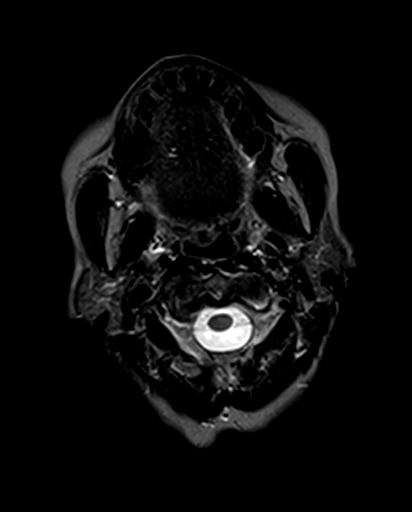
[im 23/23]
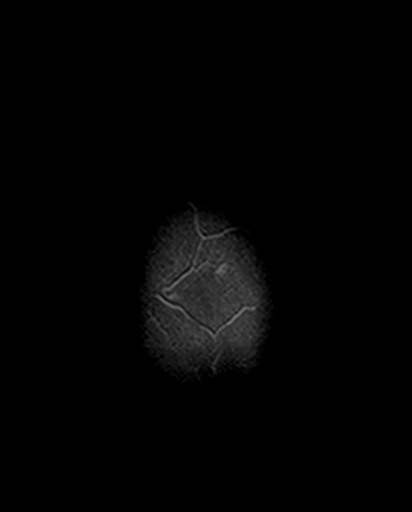

[Series 6: T2 · axial · 5.0mm · 0.45mm/px · z∈[-59,+95]mm · 2 of 23 slices shown (2 of 3)]
[im 1/23]
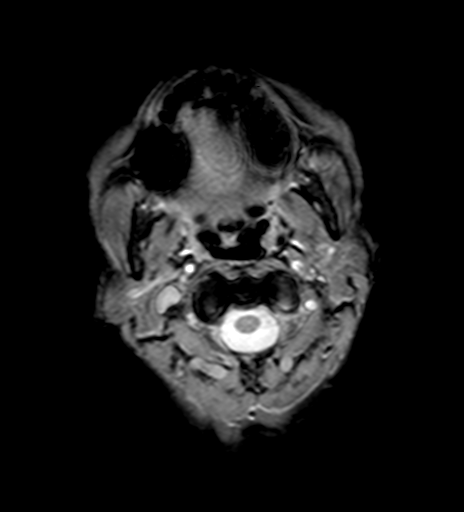
[im 23/23]
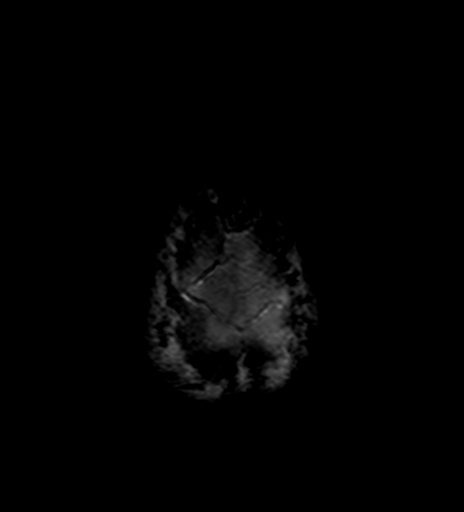

[Series 7: FLAIR · axial · 3.0mm · 0.45mm/px · z∈[-60,+96]mm · 3 of 27 slices shown]
[im 1/27]
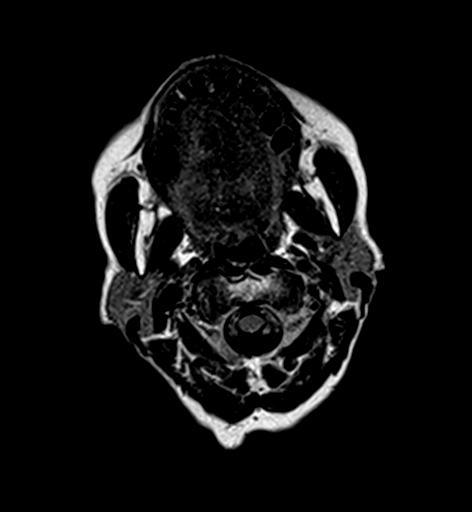
[im 14/27]
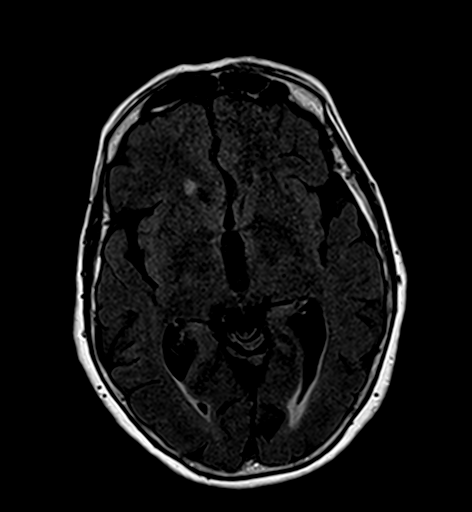
[im 27/27]
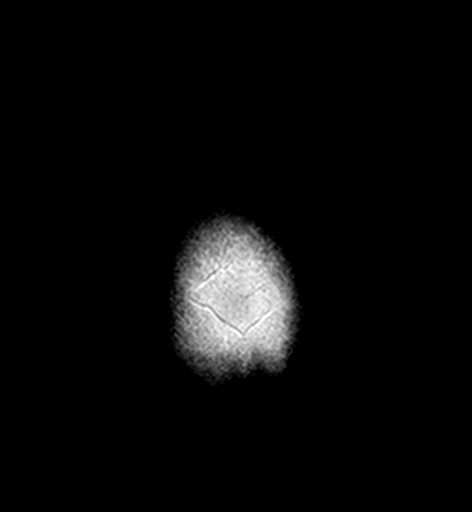

[Series 9: T2 · coronal · 5.0mm · 0.45mm/px · 3 of 28 slices shown (3 of 3)]
[im 1/28]
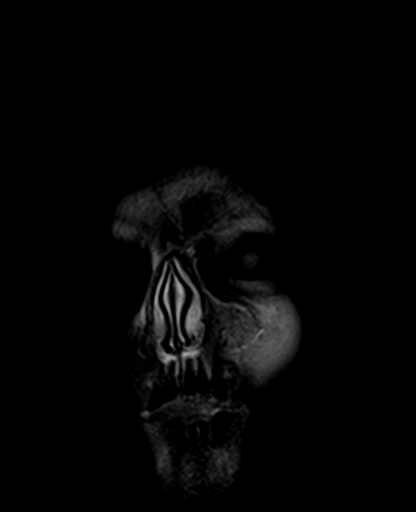
[im 14/28]
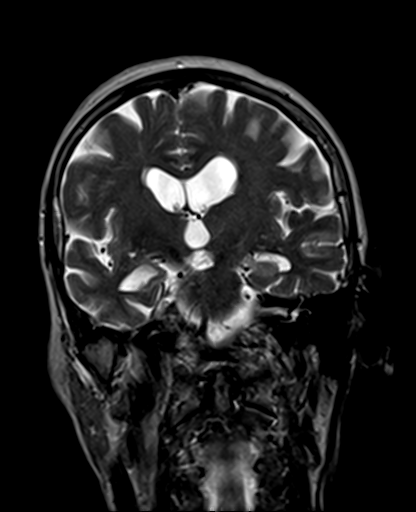
[im 28/28]
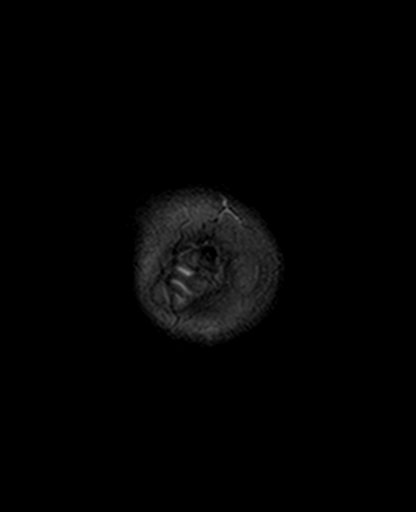

[Series 10: DWI · coronal · 3.0mm · 1.46mm/px · 10 of 90 slices shown (3 of 4)]
[im 1/90]
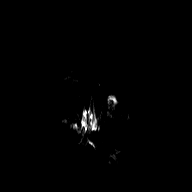
[im 10/90]
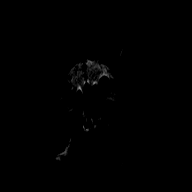
[im 20/90]
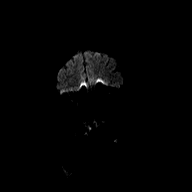
[im 30/90]
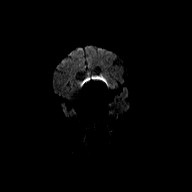
[im 40/90]
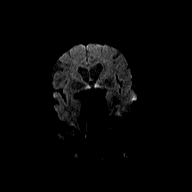
[im 50/90]
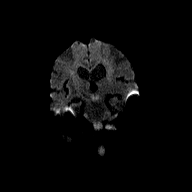
[im 60/90]
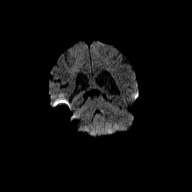
[im 70/90]
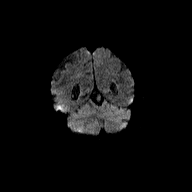
[im 80/90]
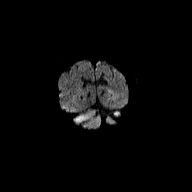
[im 90/90]
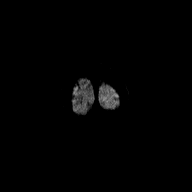

[Series 11: DWI · coronal · 3.0mm · 1.46mm/px · 5 of 45 slices shown (4 of 4)]
[im 1/45]
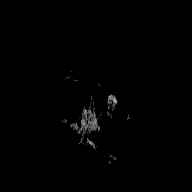
[im 12/45]
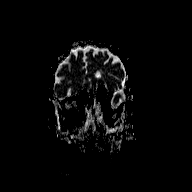
[im 23/45]
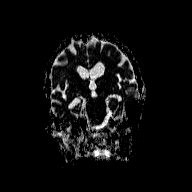
[im 34/45]
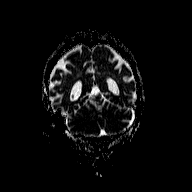
[im 45/45]
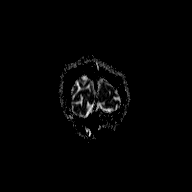

[42 of 48 positions shown; findings below may reference images not displayed]

FINDINGS: Brain: Mild atrophy and white matter changes are within normal
limits for age. No acute infarct, hemorrhage, or mass lesion is
present. The ventricles are of normal size. No significant
extraaxial fluid collection is present. The ventricles are
proportionate to the degree of atrophy.

Vascular: Flow is present in the major intracranial arteries.

Skull and upper cervical spine: The skullbase is within normal
limits. The craniocervical junction is normal. Midline sagittal
structures are within normal limits.

Sinuses/Orbits: Mild mucosal thickening is present in the maxillary
sinuses bilaterally. No fluid levels are present. The remaining
paranasal sinuses and mastoid air cells are clear. Globes and orbits
are within normal limits.
IMPRESSION: 1. Normal MRI appearance of the brain for age.

## 2018-11-08 NOTE — Progress Notes (Signed)
Virtual Visit via Telephone Note  I connected with Ariel Archer on 11/21/18 at  4:30 PM EDT by telephone and verified that I am speaking with the correct person using two identifiers.   I discussed the limitations, risks, security and privacy concerns of performing an evaluation and management service by telephone and the availability of in person appointments. I also discussed with the patient that there may be a patient responsible charge related to this service. The patient expressed understanding and agreed to proceed.      I discussed the assessment and treatment plan with the patient. The patient was provided an opportunity to ask questions and all were answered. The patient agreed with the plan and demonstrated an understanding of the instructions.   The patient was advised to call back or seek an in-person evaluation if the symptoms worsen or if the condition fails to improve as anticipated.  I provided 15 minutes of non-face-to-face time during this encounter.   Ariel Hotter, MD     [facility] MD/PA/NP OP Progress Note  11/21/2018 5:00 PM Ariel Archer  MRN:  [id number]  Chief Complaint:  Chief Complaint    Follow-up; Memory Loss     HPI:  This is a follow-up appointment for depression and cognitive impairment.  She states that she has been doing well.  She enjoys taking a walk and playing cards.  She recently had hearing aids, which has been helpful for the patient.  She visited her children in [place].  When she is asked about paranoia about the ex-neighbor, she states that she now lives int a huge building ant there is no neighbor. She denies any concern at the current place. She denies paranoia, Ah. VH. She is aware of short term memory loss. IADL/ADL as below. She has occasional insomnia. She denies feeling depressed or anxiety. She denies SI.   Ariel Archer, her husband is on the phone.  He has not noticed any concerning episode since the last visit. She takes medication regularly.    Functional Status Instrumental Activities of Daily Living (IADLs):  Ariel Archer is independent in the following: managing finances, medications, cooking Requires assistance with the following:   Activities of Daily Living (ADLs):  Ariel Archer is independent in the following: bathing and hygiene, feeding, continence, grooming and toileting, walking  134 lbs   Visit Diagnosis:    ICD-10-CM   1. Mild neurocognitive disorder  G31.84     Past Psychiatric History: Please see initial evaluation for full details. I have reviewed the history. No updates at this time.     Past Medical History: No past medical history on file. No past surgical history on file.  Family Psychiatric History: Please see initial evaluation for full details. I have reviewed the history. No updates at this time.     Family History: No family history on file.  Social History:  Social History   Socioeconomic History  . Marital status: Married    Spouse name: Not on file  . Number of children: 2  . Years of education: Not on file  . Highest education level: Some college, no degree  Occupational History  . Not on file  Social Needs  . Financial resource strain: Not hard at all  . Food insecurity    Worry: Never true    Inability: Never true  . Transportation needs    Medical: No    Non-medical: No  Tobacco Use  . Smoking status: Never Smoker  . Smokeless tobacco: Never Used  Substance and Sexual Activity  . Alcohol use: No    Comment: 06-09-2016 per pt rarely  . Drug use: No  . Sexual activity: Not Currently  Lifestyle  . Physical activity    Days per week: 2 days    Minutes per session: 30 min  . Stress: Not at all  Relationships  . Social Musicianconnections    Talks on phone: Not on file    Gets together: Not on file    Attends religious service: Never    Active member of club or organization: Yes    Attends meetings of clubs or organizations: More than 4 times per year    Relationship  status: Married  Other Topics Concern  . Not on file  Social History Narrative  . Not on file    Allergies: No Known Allergies  Metabolic Disorder Labs: Lab Results  Component Value Date   HGBA1C 5.4 06/10/2016   MPG 108 06/10/2016   No results found for: PROLACTIN Lab Results  Component Value Date   CHOL 192 06/10/2016   TRIG 106 06/10/2016   HDL 57 06/10/2016   CHOLHDL 3.4 06/10/2016   VLDL 21 06/10/2016   LDLCALC 114 (H) 06/10/2016   Lab Results  Component Value Date   TSH 1.00 06/10/2016    Therapeutic Level Labs: No results found for: LITHIUM No results found for: VALPROATE No components found for:  CBMZ  Current Medications: Current Outpatient Medications  Medication Sig Dispense Refill  . [START ON 12/29/2018] ARIPiprazole (ABILIFY) 5 MG tablet Take 0.5 tablets (2.5 mg total) by mouth daily. 45 tablet 1  . L-METHYLFOLATE CALCIUM PO Take 1 tablet by mouth daily.    Marland Kitchen. lisinopril (PRINIVIL,ZESTRIL) 40 MG tablet Take 40 mg by mouth daily.  3  . Multi Vit-Fluoride-Folic Acid (MULTIVITAMIN/FLUORIDE PO) Take 0.25 mg by mouth daily.    Melene Muller. [START ON 12/27/2018] rivastigmine (EXELON) 1.5 MG capsule Take 1 capsule (1.5 mg total) by mouth 2 (two) times daily. 180 capsule 1  . rOPINIRole (REQUIP) 1 MG tablet Take 2 tablets by mouth daily.     . traZODone (DESYREL) 50 MG tablet 25-50 mg at night as needed for sleep 90 tablet 1   No current facility-administered medications for this visit.      Musculoskeletal: Strength & Muscle Tone: N/A Gait & Station: N/A Patient leans: N/A  Psychiatric Specialty Exam: Review of Systems  Psychiatric/Behavioral: Positive for memory loss. Negative for depression, hallucinations, substance abuse and suicidal ideas. The patient has insomnia. The patient is not nervous/anxious.   All other systems reviewed and are negative.   There were no vitals taken for this visit.There is no height or weight on file to calculate BMI.  General  Appearance: NA  Eye Contact:  NA  Speech:  Clear and Coherent  Volume:  Normal  Mood:  "good"  Affect:  NA  Thought Process:  Coherent  Orientation:  Full (Time, Place, and Person)  Thought Content: Logical   Suicidal Thoughts:  No  Homicidal Thoughts:  No  Memory:  Immediate;   Good  Judgement:  Good  Insight:  Fair  Psychomotor Activity:  Normal  Concentration:  Concentration: Good and Attention Span: Good  Recall:  Fair  Fund of Knowledge: Good  Language: Good  Akathisia:  No  Handed:  Right  AIMS (if indicated): not done  Assets:  Communication Skills Desire for Improvement  ADL's:  Intact  Cognition: WNL  Sleep:  Fair   Screenings: MOCA 22/30 on  06/23/2016 (-1 for attention, -1 for language, -5 or delayed recall, -1 for orientation "April 8th") MOCA17/30 on07/30/19(-2 for visuospatial, -2 for attention, -2 for language, -1 for abstraction, -5 for delayed recall, -1 for orientation (July 23rd) )  Assessment and Plan:  Ariel Archer is a 78 y.o. year old female with a history of mild neurocognitive disorder, delusion, hypertension, restless leg,lumbar degenerative discdisease , who presents for follow up appointment for Mild neurocognitive disorder   # Mild neurocognitive disorder  #Delusion- resolved # r/o Lewy body dementia There has been no delusions since the last visit.  She has had fluctuating symptoms of paranoia and cognitive deficits, which is highly likely to be attributable to Lew body dementia.  Although she will benefit from neuropsychological testing, she does not want to pursue this option.  Head MRI, labs with no abnormality to suggest treatable cause of dementia. Will continue Rivastigmine to target dementia. Discussed potential GI side effect. Will continue Abilify to target paranoia. Discussed potential metabolic side effect and EPS.   Plan I have reviewed and updated plans as below 1. Continue Rivastigmine 1.5 mg twice a day  2. Continue Abilify  2.5 mg daily 3. Next appointment: Return in March or sooner as needed 4. Referral to neuropsychological testing; patient declined - will consider MOCA at the next visit  The patient demonstrates the following risk factors for suicide: Chronic risk factors for suicide include: psychiatric disorder of paranoia. Acute risk factorsfor suicide include: unemployment and recent discharge from inpatient psychiatry. Protective factorsfor this patient include: positive social support, coping skills and hope for the future. Considering these factors, the overall suicide risk at this point appears to be low. Patient isappropriate for outpatient follow up.   Ariel Clay, MD 11/21/2018, 5:00 PM

## 2018-11-11 ENCOUNTER — Ambulatory Visit (HOSPITAL_COMMUNITY): Payer: Medicare Other | Admitting: Psychiatry

## 2018-11-21 ENCOUNTER — Other Ambulatory Visit: Payer: Self-pay

## 2018-11-21 ENCOUNTER — Ambulatory Visit (INDEPENDENT_AMBULATORY_CARE_PROVIDER_SITE_OTHER): Payer: Medicare Other | Admitting: Psychiatry

## 2018-11-21 ENCOUNTER — Encounter (HOSPITAL_COMMUNITY): Payer: Self-pay | Admitting: Psychiatry

## 2018-11-21 DIAGNOSIS — G3184 Mild cognitive impairment, so stated: Secondary | ICD-10-CM

## 2018-11-21 MED ORDER — ARIPIPRAZOLE 5 MG PO TABS: 2.5000 mg | ORAL_TABLET | Freq: Every day | ORAL | 1 refills | Status: DC

## 2018-11-21 MED ORDER — RIVASTIGMINE TARTRATE 1.5 MG PO CAPS: 1.5000 mg | ORAL_CAPSULE | Freq: Two times a day (BID) | ORAL | 1 refills | Status: DC

## 2018-11-21 NOTE — Patient Instructions (Signed)
1. Continue Rivastigmine 1.5 mg twice a day  2. Continue Abilify 2.5 mg daily 3. Next appointment: Return in March

## 2019-01-09 ENCOUNTER — Other Ambulatory Visit (HOSPITAL_COMMUNITY): Payer: Self-pay | Admitting: Psychiatry

## 2019-01-09 ENCOUNTER — Telehealth (HOSPITAL_COMMUNITY): Payer: Self-pay | Admitting: Psychiatry

## 2019-01-09 MED ORDER — TRAZODONE HCL 50 MG PO TABS: ORAL_TABLET | ORAL | 1 refills | Status: DC

## 2019-01-09 NOTE — Telephone Encounter (Signed)
SPOKE WITH PATIENT'S HUSBAND CONCERNING PROVIDER REQUEST: refill request for Trazodone. Could you ask the patient if she still takes this medication- it was discontinued in the past.   PATIENT WAS HAVING TROUBLE SLEEPING &  THERE WAS SOME TRAZODONE STILL LEFTOVER SO THEY JUST CONTINUED. AND WOULD LIKE TO CONTINUE & REQUESTED SCRIPT BE SENT

## 2019-01-09 NOTE — Telephone Encounter (Signed)
ordered

## 2019-01-09 NOTE — Telephone Encounter (Signed)
Received refill request for Trazodone. Could you ask the patient if she still takes this medication- it was discontinued in the past.

## 2019-05-21 ENCOUNTER — Telehealth (HOSPITAL_COMMUNITY): Payer: Self-pay | Admitting: Psychiatry

## 2019-05-21 MED ORDER — ARIPIPRAZOLE 5 MG PO TABS: 2.5000 mg | ORAL_TABLET | Freq: Every day | ORAL | 0 refills | Status: DC

## 2019-05-21 NOTE — Telephone Encounter (Signed)
Ordered refill of Abilify per request. Please contact the patient to make follow up appointment. I will not be able to order refills without evaluation.

## 2019-05-21 NOTE — Telephone Encounter (Signed)
CALLED & SPOKE WITH MR Ariel Archer PER PROVIDER: Ordered refill of Abilify per request. Please contact the patient to make follow up appointment. I will not be able to order refills without evaluation.  APPT SCHEDULED FOR  05/31/19 @ 3:30 PM

## 2019-05-23 NOTE — Progress Notes (Signed)
Virtual Visit via Telephone Note  I connected with Crissie Alperin on 05/31/19 at  3:30 PM EDT by telephone and verified that I am speaking with the correct person using two identifiers.   I discussed the limitations, risks, security and privacy concerns of performing an evaluation and management service by telephone and the availability of in person appointments. I also discussed with the patient that there may be a patient responsible charge related to this service. The patient expressed understanding and agreed to proceed.     I discussed the assessment and treatment plan with the patient. The patient was provided an opportunity to ask questions and all were answered. The patient agreed with the plan and demonstrated an understanding of the instructions.   The patient was advised to call back or seek an in-person evaluation if the symptoms worsen or if the condition fails to improve as anticipated.  I provided 15 minutes of non-face-to-face time during this encounter.   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  [name], her husband presents to the interview. The majority of the history is provided with the help of [name].  She states that she has been doing well.  She enjoys taking a walk every other day, and works on puzzle. She has not had any episode of concerning of neighborhood as before. She had a nice birthday and enjoyed holiday last year. She was unable to describe how [name] helps her to take her medication. [name] put pills in a pill box and makes sure that she takes it regularly). Although she initially denies having memory loss, she later states that she tends to forget things which is "not important." She denies feeling depressed or anxiety. She sleeps well.  She has good appetite. She denies paranoia. She denies SI, AH, VH.    Visit Diagnosis:    ICD-10-CM   1. Mild neurocognitive disorder  G31.84     Past Psychiatric History: Please see initial evaluation for full details. I have reviewed the history. No updates at this time.     Past Medical History: No past medical history on file. No past surgical history on file.  Family Psychiatric History: Please see initial evaluation for full details. I have reviewed the history. No updates at this time.     Family History: No family history on file.  Social History:  Social History   Socioeconomic History  . Marital status: Married    Spouse name: Not on file  . Number of children: 2  . Years of education: Not on file  . Highest education level: Some college, no degree  Occupational History  . Not on file  Tobacco Use  . Smoking status: Never Smoker  . Smokeless tobacco: Never Used  Substance and Sexual Activity  . Alcohol use: No    Comment: 06-09-2016 per pt rarely  . Drug use: No  . Sexual activity: Not Currently  Other Topics Concern  . Not on file  Social History Narrative  . Not on file   Social Determinants of Health   Financial Resource Strain:   . Difficulty of Paying Living Expenses:   Food Insecurity:   . Worried About Programme researcher, broadcasting/film/video in the Last Year:   . [employer]  of Food in the Last Year:   Transportation Needs:   . Film/video editor (Medical):   Marland Kitchen Lack of Transportation (Non-Medical):   Physical Activity:   . Days of Exercise per Week:   . Minutes of Exercise per Session:   Stress:   . Feeling of Stress :   Social Connections:   . Frequency of Communication with Friends and Family:   . Frequency of Social Gatherings with Friends and Family:   . Attends Religious Services:   . Active Member of Clubs or Organizations:   . Attends Archivist Meetings:   Marland Kitchen Marital Status:     Allergies: No Known Allergies  Metabolic Disorder  Labs: Lab Results  Component Value Date   HGBA1C 5.4 06/10/2016   MPG 108 06/10/2016   No results found for: PROLACTIN Lab Results  Component Value Date   CHOL 192 06/10/2016   TRIG 106 06/10/2016   HDL 57 06/10/2016   CHOLHDL 3.4 06/10/2016   VLDL 21 06/10/2016   LDLCALC 114 (H) 06/10/2016   Lab Results  Component Value Date   TSH 1.00 06/10/2016    Therapeutic Level Labs: No results found for: LITHIUM No results found for: VALPROATE No components found for:  CBMZ  Current Medications: Current Outpatient Medications  Medication Sig Dispense Refill  . ARIPiprazole (ABILIFY) 5 MG tablet Take 0.5 tablets (2.5 mg total) by mouth daily. 45 tablet 0  . L-METHYLFOLATE CALCIUM PO Take 1 tablet by mouth daily.    Marland Kitchen lisinopril (PRINIVIL,ZESTRIL) 40 MG tablet Take 40 mg by mouth daily.  3  . Multi Vit-Fluoride-Folic Acid (MULTIVITAMIN/FLUORIDE PO) Take 0.25 mg by mouth daily.    . rivastigmine (EXELON) 1.5 MG capsule Take 1 capsule (1.5 mg total) by mouth 2 (two) times daily. 180 capsule 1  . rOPINIRole (REQUIP) 1 MG tablet Take 2 tablets by mouth daily.     . traZODone (DESYREL) 50 MG tablet 25-50 mg at night as needed for sleep 90 tablet 1   No current facility-administered medications for this visit.     Musculoskeletal: Strength & Muscle Tone: N/A Gait & Station: N/A Patient leans: N/A  Psychiatric Specialty Exam: Review of Systems  There were no vitals taken for this visit.There is no height or weight on file to calculate BMI.  General Appearance: NA  Eye Contact:  NA  Speech:  Clear and Coherent  Volume:  Normal  Mood:  "good"  Affect:  NA  Thought Process:  Coherent  Orientation:  Full (Time, Place, and Person)  Thought Content: Logical   Suicidal Thoughts:  No  Homicidal Thoughts:  No  Memory:  Immediate;   Good  Judgement:  Good  Insight:  Present  Psychomotor Activity:  Normal  Concentration:  Concentration: Good and Attention Span: Good  Recall:  Poor   Fund of Knowledge: Good  Language: Good  Akathisia:  No  Handed:  Right  AIMS (if indicated): not done  Assets:  Communication Skills Desire for Improvement  ADL's:  Intact  Cognition: WNL  Sleep:  Good   Screenings:  MOCA 22/30 on 06/23/2016 (-1 for attention, -1 for language, -5 or delayed recall, -1 for orientation "April 8th") MOCA17/30 on07/30/19(-2 for visuospatial, -2 for attention, -2 for language, -1 for abstraction, -5 for delayed recall, -1 for orientation (July 23rd) )  Assessment and Plan:  Judieth Mckown is a 79 y.o. year old female with a history of mild neurocognitive disorder, delusion, hypertension,restless leg,lumbar degenerative discdisease , who  presents for follow up appointment for Mild neurocognitive disorder  # Mild neurocognitive disorder # Delusion- resolved # r/o Lewy body dementia Exam is notable for long term memory loss, and her husband helps to provide majority of the history. Her husband and the patient deny any delusional episode since the last visit.  She has had fluctuating symptoms of paranoia and cognitive deficits, which is highly likely to be attributable to Lewy body dementia.  Although she will benefit from neuropsychological testing, she does not want to pursue this.  Had MRI, labs with no abnormality to suggest treatable cause of dementia.  Will continue rivastigmine to target dementia.  Will discontinue Abilify to avoid long-term risk.  The patient and her husband is advised to contact the office if any recurrence of paranoia.   Plan I have reviewed and updated plans as below 1.ContinueRivastigmine 1.5 mg twice a day  2. Discontinue Abilify 3.Next appointment: 7/12 at 1 PM for 20 mins, video 4. 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 Acute risk factorsfor suicide include: unemployment and recent discharge from inpatient psychiatry. Protective factorsfor this patient include: positive social support, coping skills and hope for the future. Considering these factors, the overall suicide risk at this point appears to be low. Patient isappropriate for outpatient follow up.  Neysa Hotter, MD 05/31/2019, 3:43 PM

## 2019-05-31 ENCOUNTER — Ambulatory Visit (INDEPENDENT_AMBULATORY_CARE_PROVIDER_SITE_OTHER): Payer: Medicare Other | Admitting: Psychiatry

## 2019-05-31 ENCOUNTER — Other Ambulatory Visit: Payer: Self-pay

## 2019-05-31 ENCOUNTER — Encounter (HOSPITAL_COMMUNITY): Payer: Self-pay | Admitting: Psychiatry

## 2019-05-31 DIAGNOSIS — G3184 Mild cognitive impairment, so stated: Secondary | ICD-10-CM

## 2019-05-31 MED ORDER — RIVASTIGMINE TARTRATE 1.5 MG PO CAPS: 1.5000 mg | ORAL_CAPSULE | Freq: Two times a day (BID) | ORAL | 1 refills | Status: DC

## 2019-05-31 NOTE — Patient Instructions (Signed)
1.ContinueRivastigmine 1.5 mg twice a day  2. Discontinue Abilify 3.Next appointment: 7/12 at 1 PM

## 2019-08-20 ENCOUNTER — Telehealth (HOSPITAL_COMMUNITY): Payer: Self-pay | Admitting: *Deleted

## 2019-08-20 NOTE — Telephone Encounter (Signed)
Spoke with patient husband and informed him with what provider stated and he verbalized understanding.

## 2019-08-20 NOTE — Telephone Encounter (Signed)
Advise her/him to reinitiate Abilify 2.5 mg daily, and have sooner follow up for 30 mins. Let me know if she needs refill of Abilify.

## 2019-08-20 NOTE — Telephone Encounter (Signed)
Patient husband called stating since patient has been off of her Abilify, patient has been having mood swings. Per pt husband, he would like to know if patient can go back on the Ability and if not what should he do. Patient husband number is [phone].

## 2019-08-28 ENCOUNTER — Other Ambulatory Visit (HOSPITAL_COMMUNITY): Payer: Self-pay | Admitting: Psychiatry

## 2019-08-28 MED ORDER — ARIPIPRAZOLE 5 MG PO TABS: 2.5000 mg | ORAL_TABLET | Freq: Every day | ORAL | 0 refills | Status: DC

## 2019-08-30 NOTE — Progress Notes (Signed)
Virtual Visit via Video Note  I connected with Ariel Archer on 09/04/19 at  3:50 PM EDT by a video enabled telemedicine application and verified that I am speaking with the correct person using two identifiers.   I discussed the limitations of evaluation and management by telemedicine and the availability of in person appointments. The patient expressed understanding and agreed to proceed.    I discussed the assessment and treatment plan with the patient. The patient was provided an opportunity to ask questions and all were answered. The patient agreed with the plan and demonstrated an understanding of the instructions.   The patient was advised to call back or seek an in-person evaluation if the symptoms worsen or if the condition fails to improve as anticipated.  Location: patient- home, provider- home office   I provided 15 minutes of non-face-to-face time during this encounter.   Norman Clay, MD    [facility] MD/PA/NP OP Progress Note  09/04/2019 4:29 PM [place]em[place]  MRN:  [id number]  Chief Complaint:  Chief Complaint    Follow-up; Other     HPI:  This is a follow-up appointment for neurocognitive disorder and paranoia.  This appointment was made urgently due to her husband's concern.   She met her husband, Ariel Archer answers most of the questions, stating that she cannot understand this provider.  Ariel Archer states that [place] was very upset, and requested to go home when they visited their son's home. Although both of them felt that their daughter in law did not want them there, Ariel Archer thinks that [place] has been more irritable since discontinuation of Abilify.  Ariel Archer thinks that [name] has been more forgetful, although [place]ika does not think she has memory issues.  She forgets people's name.  She appears confused, stating that she occasionally does not know where she is  Ariel Archer denies any aggressive behavior or safety concern.  She sleeps well and eats well.  Still is not aware of any recent  paranoia.  No known hallucinations.  Ariel Archer thinks that she has been less irritable since starting Abilify.    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 Paranoia ([place])  [place]     Past Psychiatric History: Please see initial evaluation for full details. I have reviewed the history. No updates at this time.     Past Medical History: No past medical history on file. No past surgical history on file.  Family Psychiatric History: Please see initial evaluation for full details. I have reviewed the history. No updates at this time.     Family History: No family history on file.  Social History:  Social History   Socioeconomic History  . Marital status: Married    Spouse name: Not on file  . Number of children: 2  . Years of education: Not on file  . Highest education level: Some college, no degree  Occupational History  . Not on file  Tobacco Use  . Smoking status: Never Smoker  . Smokeless tobacco: Never Used  Vaping Use  . Vaping Use: Never used  Substance and Sexual Activity  . Alcohol use: No    Comment: 06-09-2016 per pt rarely  . Drug use: No  . Sexual activity: Not Currently  Other Topics Concern  . Not on file  Social History Narrative  . Not on file   Social Determinants of Health   Financial Resource Strain:   . Difficulty of Paying Living Expenses:   Food  Insecurity:   . Worried About Charity fundraiser in the Last Year:   . Arboriculturist in the Last Year:   Transportation Needs:   . Film/video editor (Medical):   Marland Kitchen Lack of Transportation (Non-Medical):   Physical Activity:   . Days of Exercise per Week:   . Minutes of Exercise per Session:   Stress:   . Feeling of Stress :   Social Connections:   . Frequency of Communication with Friends and Family:   . Frequency of Social Gatherings with Friends and Family:   . Attends Religious Services:   . Active Member of Clubs or Organizations:   . Attends Theatre manager Meetings:   Marland Kitchen Marital Status:     Allergies: No Known Allergies  Metabolic Disorder Labs: Lab Results  Component Value Date   HGBA1C 5.4 06/10/2016   MPG 108 06/10/2016   No results found for: PROLACTIN Lab Results  Component Value Date   CHOL 192 06/10/2016   TRIG 106 06/10/2016   HDL 57 06/10/2016   CHOLHDL 3.4 06/10/2016   VLDL 21 06/10/2016   LDLCALC 114 (H) 06/10/2016   Lab Results  Component Value Date   TSH 1.00 06/10/2016    Therapeutic Level Labs: No results found for: LITHIUM No results found for: VALPROATE No components found for:  CBMZ  Current Medications: Current Outpatient Medications  Medication Sig Dispense Refill  . ARIPiprazole (ABILIFY) 5 MG tablet Take 0.5 tablets (2.5 mg total) by mouth daily. 15 tablet 0  . L-METHYLFOLATE CALCIUM PO Take 1 tablet by mouth daily.    Marland Kitchen lisinopril (PRINIVIL,ZESTRIL) 40 MG tablet Take 40 mg by mouth daily.  3  . Multi Vit-Fluoride-Folic Acid (MULTIVITAMIN/FLUORIDE PO) Take 0.25 mg by mouth daily.    . rivastigmine (EXELON) 1.5 MG capsule Take 1 capsule (1.5 mg total) by mouth 2 (two) times daily. 180 capsule 1  . rOPINIRole (REQUIP) 1 MG tablet Take 2 tablets by mouth daily.      No current facility-administered medications for this visit.     Musculoskeletal: Strength & Muscle Tone: N/A Gait & Station: N/A Patient leans: N/A  Psychiatric Specialty Exam: Review of Systems  Psychiatric/Behavioral: Positive for confusion. Negative for agitation, behavioral problems, decreased concentration, dysphoric mood, hallucinations, self-injury, sleep disturbance and suicidal ideas. The patient is not nervous/anxious and is not hyperactive.   All other systems reviewed and are negative.   There were no vitals taken for this visit.There is no height or weight on file to calculate BMI.  General Appearance: Fairly Groomed  Eye Contact:  Good  Speech:  Clear and Coherent  Volume:  Normal  Mood:  good   Affect:  Appropriate, Restricted and slightly tense  Thought Process:  Coherent  Orientation:  Full (Time, Place, and Person) except time  Thought Content: Logical , denies paranoia  Suicidal Thoughts:  No  Homicidal Thoughts:  No  Memory:  Immediate;   Fair  Judgement:  Fair  Insight:  Shallow  Psychomotor Activity:  Normal  Concentration:  Concentration: Good and Attention Span: Good  Recall:  Good  Fund of Knowledge: Good  Language: Good  Akathisia:  No  Handed:  Right  AIMS (if indicated): not done  Assets:  Communication Skills  ADL's:  Intact  Cognition: WNL  Sleep:  Good   Screenings:  MOCA 22/30 on 06/23/2016 (-1 for attention, -1 for language, -5 or delayed recall, -1 for orientation "April 8th") MOCA17/30 on07/30/19(-2 for visuospatial, -  2 for attention, -2 for language, -1 for abstraction, -5 for delayed recall, -1 for orientation (July 23rd) ) Delayed recall 0/3, unable to tell the year, month, day, date 09/04/19  Assessment and Plan:  Joeline Freer is a 79 y.o. year old female with a history of mild neurocognitive disorder, delusion, hypertension,restless leg,lumbar degenerative discdisease, who presents for follow up appointment for below.   1. Mild neurocognitive disorder 2. Paranoia ([place])- resolved There has been worsening in memory loss, and her husband reports worsening in irritability since discontinuation of Abilify.  Her clinical course of fluctuating symptoms of paranoia, personality change with worsening in cognitive deficits fits with Lewy body dementia.  Will make referral for neuropsychological testing to delineate the cause.  She has had MRI, labs in 2018 showed no significant abnormality to suggest treatable cause of dementia.  We will continue rivastigmine to target dementia. Noted that Abilify is recently initiated since the phone call from her husband.  Will continue Abilify at the current dose given it has been beneficial for her irritability,  while monitoring side effects which includes but not limited to EPS.   Plan I have reviewed and updated plans as below 1.ContinueRivastigmine 1.5 mg twice a day  2. Continue Abilify 2.5 mg daily  3.Next appointment: 8/5 at 10 AM for 30 mins, video 4. Referral for neuropsychological testing for neurocognitive disorder - will consider recheck labs  The patient demonstrates the following risk factors for suicide: Chronic risk factors for suicide include: psychiatric disorder of paranoia. Acute risk factorsfor suicide include: unemployment and recent discharge from inpatient psychiatry. Protective factorsfor this patient include: positive social support, coping skills and hope for the future. Considering these factors, the overall suicide risk at this point appears to be low. Patient isappropriate for outpatient follow up.  Norman Clay, MD 09/04/2019, 4:29 PM

## 2019-09-04 ENCOUNTER — Encounter (HOSPITAL_COMMUNITY): Payer: Self-pay | Admitting: Psychiatry

## 2019-09-04 ENCOUNTER — Other Ambulatory Visit: Payer: Self-pay

## 2019-09-04 ENCOUNTER — Telehealth (INDEPENDENT_AMBULATORY_CARE_PROVIDER_SITE_OTHER): Payer: Medicare Other | Admitting: Psychiatry

## 2019-09-04 DIAGNOSIS — F22 Delusional disorders: Secondary | ICD-10-CM

## 2019-09-04 DIAGNOSIS — G3184 Mild cognitive impairment, so stated: Secondary | ICD-10-CM

## 2019-09-24 ENCOUNTER — Ambulatory Visit (HOSPITAL_COMMUNITY): Payer: Medicare Other | Admitting: Psychiatry

## 2019-10-10 ENCOUNTER — Telehealth (HOSPITAL_COMMUNITY): Payer: Self-pay | Admitting: Psychiatry

## 2019-10-10 DIAGNOSIS — G3184 Mild cognitive impairment, so stated: Secondary | ICD-10-CM

## 2019-10-10 NOTE — Progress Notes (Deleted)
Psychiatric Initial Adult Assessment   Patient Identification: Ariel Archer MRN:  [id number] Date of Evaluation:  10/10/2019 Referral Source: *** Chief Complaint:   Visit Diagnosis: No diagnosis found.  History of Present Illness:  ***  Associated Signs/Symptoms: Depression Symptoms:  {DEPRESSION SYMPTOMS:20000} (Hypo) Manic Symptoms:  {BHH MANIC SYMPTOMS:22872} Anxiety Symptoms:  {BHH ANXIETY SYMPTOMS:22873} Psychotic Symptoms:  {BHH PSYCHOTIC SYMPTOMS:22874} PTSD Symptoms: {BHH PTSD SYMPTOMS:22875}  Past Psychiatric History: ***  Previous Psychotropic Medications: {YES/NO:21197}  Substance Abuse History in the last 12 months:  {yes no:314532}  Consequences of Substance Abuse: {BHH CONSEQUENCES OF SUBSTANCE ABUSE:22880}  Past Medical History: No past medical history on file. No past surgical history on file.  Family Psychiatric History: ***  Family History: No family history on file.  Social History:   Social History   Socioeconomic History  . Marital status: Married    Spouse name: Not on file  . Number of children: 2  . Years of education: Not on file  . Highest education level: Some college, no degree  Occupational History  . Not on file  Tobacco Use  . Smoking status: Never Smoker  . Smokeless tobacco: Never Used  Vaping Use  . Vaping Use: Never used  Substance and Sexual Activity  . Alcohol use: No    Comment: 06-09-2016 per pt rarely  . Drug use: No  . Sexual activity: Not Currently  Other Topics Concern  . Not on file  Social History Narrative  . Not on file   Social Determinants of Health   Financial Resource Strain:   . Difficulty of Paying Living Expenses:   Food Insecurity:   . Worried About Programme researcher, broadcasting/film/video in the Last Year:   . Barista in the Last Year:   Transportation Needs:   . Freight forwarder (Medical):   Marland Kitchen Lack of Transportation (Non-Medical):   Physical Activity:   . Days of Exercise per Week:   . Minutes of  Exercise per Session:   Stress:   . Feeling of Stress :   Social Connections:   . Frequency of Communication with Friends and Family:   . Frequency of Social Gatherings with Friends and Family:   . Attends Religious Services:   . Active Member of Clubs or Organizations:   . Attends Banker Meetings:   Marland Kitchen Marital Status:     Additional Social History: ***  Allergies:  No Known Allergies  Metabolic Disorder Labs: Lab Results  Component Value Date   HGBA1C 5.4 06/10/2016   MPG 108 06/10/2016   No results found for: PROLACTIN Lab Results  Component Value Date   CHOL 192 06/10/2016   TRIG 106 06/10/2016   HDL 57 06/10/2016   CHOLHDL 3.4 06/10/2016   VLDL 21 06/10/2016   LDLCALC 114 (H) 06/10/2016   Lab Results  Component Value Date   TSH 1.00 06/10/2016    Therapeutic Level Labs: No results found for: LITHIUM No results found for: CBMZ No results found for: VALPROATE  Current Medications: Current Outpatient Medications  Medication Sig Dispense Refill  . ARIPiprazole (ABILIFY) 5 MG tablet Take 0.5 tablets (2.5 mg total) by mouth daily. 15 tablet 0  . L-METHYLFOLATE CALCIUM PO Take 1 tablet by mouth daily.    Marland Kitchen lisinopril (PRINIVIL,ZESTRIL) 40 MG tablet Take 40 mg by mouth daily.  3  . Multi Vit-Fluoride-Folic Acid (MULTIVITAMIN/FLUORIDE PO) Take 0.25 mg by mouth daily.    . rivastigmine (EXELON) 1.5 MG capsule Take 1 capsule (  1.5 mg total) by mouth 2 (two) times daily. 180 capsule 1  . rOPINIRole (REQUIP) 1 MG tablet Take 2 tablets by mouth daily.      No current facility-administered medications for this visit.    Musculoskeletal: Strength & Muscle Tone: {desc; muscle tone:32375} Gait & Station: {PE GAIT ED TKWI:09735} Patient leans: {Patient Leans:21022755}  Psychiatric Specialty Exam: Review of Systems  There were no vitals taken for this visit.There is no height or weight on file to calculate BMI.  General Appearance: {Appearance:22683}  Eye  Contact:  {BHH EYE CONTACT:22684}  Speech:  {Speech:22685}  Volume:  {Volume (PAA):22686}  Mood:  {BHH MOOD:22306}  Affect:  {Affect (PAA):22687}  Thought Process:  {Thought Process (PAA):22688}  Orientation:  {BHH ORIENTATION (PAA):22689}  Thought Content:  {Thought Content:22690}  Suicidal Thoughts:  {ST/HT (PAA):22692}  Homicidal Thoughts:  {ST/HT (PAA):22692}  Memory:  {BHH MEMORY:22881}  Judgement:  {Judgement (PAA):22694}  Insight:  {Insight (PAA):22695}  Psychomotor Activity:  {Psychomotor (PAA):22696}  Concentration:  {Concentration:21399}  Recall:  {BHH GOOD/FAIR/POOR:22877}  Fund of Knowledge:{BHH GOOD/FAIR/POOR:22877}  Language: {BHH GOOD/FAIR/POOR:22877}  Akathisia:  {BHH YES OR NO:22294}  Handed:  {Handed:22697}  AIMS (if indicated):  {Desc; done/not:10129}  Assets:  {Assets (PAA):22698}  ADL's:  {BHH HGD'J:24268}  Cognition: {chl bhh cognition:304700322}  Sleep:  {BHH GOOD/FAIR/POOR:22877}   Screenings:   Assessment and Plan: ***   Neysa Hotter, MD 7/28/20213:35 PM

## 2019-10-10 NOTE — Telephone Encounter (Signed)
Referral to neurology was made for neuropsychology evaluation based on the recommendation from [facility] psychology.

## 2019-10-10 NOTE — Progress Notes (Signed)
Virtual Visit via Video Note  I connected with Ariel Archer on 10/18/19 at 10:00 AM EDT by a video enabled telemedicine application and verified that I am speaking with the correct person using two identifiers.   I discussed the limitations of evaluation and management by telemedicine and the availability of in person appointments. The patient expressed understanding and agreed to proceed.    I discussed the assessment and treatment plan with the patient. The patient was provided an opportunity to ask questions and all were answered. The patient agreed with the plan and demonstrated an understanding of the instructions.   The patient was advised to call back or seek an in-person evaluation if the symptoms worsen or if the condition fails to improve as anticipated.  Location: patient- home, provider- office   I provided 14 minutes of non-face-to-face time during this encounter.   Ariel Clay, MD   [facility] MD/PA/NP OP Progress Note  10/18/2019 10:27 AM Ariel Archer  MRN:  [id number]  Chief Complaint:  Chief Complaint    Follow-up; Memory Loss; Other     HPI:  This is a follow-up appointment for neurocognitive disorder and paranoia.  She states that she has been doing well.  She does puzzles, taking a walk, or work on house chores.  She will go to [place] today with Ariel Archer.  She will visit her friend and this friend's family.  Although she is she states that she has issues with memories, she later denies that ("no concern") when she is asked to elaborate it.  She sleeps well.  She denies feeling depressed.  She denies anxiety.  She denies paranoia.  She denies AH, VH.  She does not recall that she used to have concern about neighbors before the relocation. When she is asked of orientation, she states that she feels nervous. She later states that those (name of the town, date) are not important.   Ariel Archer, her husband presents to the interview.  She is not irritable at all since reinitiation of  Abilify. Although she does have memory loss (cannot recall where she went last week or who she met), he denies significant concerns. No safety concern. She takes medication regularly.   Orientation- unable to recall the name of town, date, day, month. 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 Paranoia ([place])  [place]     Past Psychiatric History: Please see initial evaluation for full details. I have reviewed the history. No updates at this time.     Past Medical History: No past medical history on file. No past surgical history on file.  Family Psychiatric History: Please see initial evaluation for full details. I have reviewed the history. No updates at this time.     Family History: No family history on file.  Social History:  Social History   Socioeconomic History  . Marital status: Married    Spouse name: Not on file  . Number of children: 2  . Years of education: Not on file  . Highest education level:  Some college, no degree  Occupational History  . Not on file  Tobacco Use  . Smoking status: Never Smoker  . Smokeless tobacco: Never Used  Vaping Use  . Vaping Use: Never used  Substance and Sexual Activity  . Alcohol use: No    Comment: 06-09-2016 per pt rarely  . Drug use: No  . Sexual activity: Not Currently  Other Topics Concern  . Not on file  Social History Narrative  . Not on file   Social Determinants of Health   Financial Resource  Strain:   . Difficulty of Paying Living Expenses:   Food Insecurity:   . Worried About Charity fundraiser in the Last Year:   . Arboriculturist in the Last Year:   Transportation Needs:   . Film/video editor (Medical):   Marland Kitchen Lack of Transportation (Non-Medical):   Physical Activity:   . Days of Exercise per Week:   . Minutes of Exercise per Session:   Stress:   . Feeling of Stress :   Social Connections:   . Frequency of Communication with Friends and Family:   . Frequency of Social Gatherings with Friends and Family:   . Attends Religious Services:   . Active Member of Clubs or Organizations:   . Attends Archivist Meetings:   Marland Kitchen Marital Status:     Allergies: No Known Allergies  Metabolic Disorder Labs: Lab Results  Component Value Date   HGBA1C 5.4 06/10/2016   MPG 108 06/10/2016   No results found for: PROLACTIN Lab Results  Component Value Date   CHOL 192 06/10/2016   TRIG 106 06/10/2016   HDL 57 06/10/2016   CHOLHDL 3.4 06/10/2016   VLDL 21 06/10/2016   LDLCALC 114 (H) 06/10/2016   Lab Results  Component Value Date   TSH 1.00 06/10/2016    Therapeutic Level Labs: No results found for: LITHIUM No results found for: VALPROATE No components found for:  CBMZ  Current Medications: Current Outpatient Medications  Medication Sig Dispense Refill  . ARIPiprazole (ABILIFY) 5 MG tablet Take 0.5 tablets (2.5 mg total) by mouth daily. 15 tablet 0  . L-METHYLFOLATE CALCIUM PO Take 1 tablet by mouth daily.    Marland Kitchen lisinopril (PRINIVIL,ZESTRIL) 40 MG tablet Take 40 mg by mouth daily.  3  . Multi Vit-Fluoride-Folic Acid (MULTIVITAMIN/FLUORIDE PO) Take 0.25 mg by mouth daily.    . rivastigmine (EXELON) 1.5 MG capsule Take 1 capsule (1.5 mg total) by mouth 2 (two) times daily. 180 capsule 1  . rOPINIRole (REQUIP) 1 MG tablet Take 2 tablets by mouth daily.      No current facility-administered medications for this visit.     Musculoskeletal: Strength &  Muscle Tone: N/A Gait & Station: N/A Patient leans: N/A  Psychiatric Specialty Exam: Review of Systems  Psychiatric/Behavioral: Negative for agitation, behavioral problems, confusion, decreased concentration, dysphoric mood, hallucinations, self-injury, sleep disturbance and suicidal ideas. The patient is not nervous/anxious and is not hyperactive.   All other systems reviewed and are negative.   There were no vitals taken for this visit.There is no height or weight on file to calculate BMI.  General Appearance: Fairly Groomed  Eye Contact:  Good  Speech:  Clear and Coherent  Volume:  Normal  Mood:  good  Affect:  Appropriate, Congruent and Restricted  Thought Process:  Coherent  Orientation:  Full (Time, Place, and Person)  Thought Content: Logical   Suicidal Thoughts:  No  Homicidal Thoughts:  No  Memory:  Immediate;   Poor  Judgement:  Good  Insight:  Shallow  Psychomotor Activity:  Normal  Concentration:  Concentration: Good and Attention Span: Good  Recall:  Poor  Fund of Knowledge: Good  Language: Good  Akathisia:  No  Handed:  Right  AIMS (if indicated): not done  Assets:  Communication Skills Desire for Improvement  ADL's:  Intact  Cognition: WNL  Sleep:  Good   Screenings: MOCA 22/30 on 06/23/2016 (-1 for attention, -1 for language, -5 or delayed recall, -1 for orientation "April 8th") MOCA17/30 on07/30/19(-2 for visuospatial, -2 for attention, -2 for language, -1 for abstraction, -5 for delayed recall, -1 for orientation (July 23rd) ) Delayed recall 0/3, unable to tell the year, month, day, date 09/04/19   Assessment and Plan:  Ariel Archer is a 79 y.o. year old female with a history of  mild neurocognitive disorder, delusion,hypertension,restless leg,lumbar degenerative discdisease, who presents for follow up appointment for below.   1. Mild neurocognitive disorder 2. Paranoia ([place])- resolved Although she continues to have memory loss, there has been  no significant episode of irritability or paranoia since we reinitiated Abilify.  Her clinical course of fluctuating symptoms of paranoia, personality change with worsening in cognitive deficits fits with Lewy body dementia.   Referral for neuropsychological testing was made to delineate the cause. She has had MRI, labs in 2018 showed no significant abnormality to suggest treatable cause of dementia.  Will continue Abilify to target irritability. Discussed potential risk of EPS and metabolic side effect. Wioll continue Rivastigmine to target dementia.    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 Acute risk factorsfor suicide include: unemployment and recent discharge from inpatient psychiatry. Protective factorsfor this patient include: positive social support, coping skills and hope for the future. Considering these factors, the overall suicide risk at this point appears to be low. Patient isappropriate for outpatient follow up.  Ariel Clay, MD 10/18/2019, 10:27 AM

## 2019-10-17 ENCOUNTER — Encounter: Payer: Self-pay | Admitting: Psychology

## 2019-10-18 ENCOUNTER — Other Ambulatory Visit: Payer: Self-pay

## 2019-10-18 ENCOUNTER — Telehealth (INDEPENDENT_AMBULATORY_CARE_PROVIDER_SITE_OTHER): Payer: Medicare Other | Admitting: Psychiatry

## 2019-10-18 ENCOUNTER — Telehealth (HOSPITAL_COMMUNITY): Payer: Self-pay | Admitting: Psychiatry

## 2019-10-18 ENCOUNTER — Encounter (HOSPITAL_COMMUNITY): Payer: Self-pay | Admitting: Psychiatry

## 2019-10-18 DIAGNOSIS — F22 Delusional disorders: Secondary | ICD-10-CM | POA: Diagnosis not present

## 2019-10-18 DIAGNOSIS — G3184 Mild cognitive impairment, so stated: Secondary | ICD-10-CM | POA: Diagnosis not present

## 2019-10-18 MED ORDER — ARIPIPRAZOLE 5 MG PO TABS: 2.5000 mg | ORAL_TABLET | Freq: Every day | ORAL | 0 refills | Status: DC

## 2019-10-18 MED ORDER — RIVASTIGMINE TARTRATE 1.5 MG PO CAPS: 1.5000 mg | ORAL_CAPSULE | Freq: Two times a day (BID) | ORAL | 0 refills | Status: DC

## 2019-10-18 NOTE — Telephone Encounter (Signed)
LMOM

## 2019-10-18 NOTE — Telephone Encounter (Signed)
Left voice message to contact the office. Please advise her/her husband to obtain blood test (TSH, vitamin B12, folate) to rule out medical condition contributing to memory loss. No need for fasting. I would like to check these again as the last check was a few years ago. Let me know which place to order these: [facility] or [facility].

## 2019-10-24 NOTE — Telephone Encounter (Signed)
LMOM

## 2019-10-25 ENCOUNTER — Telehealth (HOSPITAL_COMMUNITY): Payer: Self-pay | Admitting: *Deleted

## 2019-10-25 NOTE — Telephone Encounter (Signed)
Patient husband called stating they were out of town. Per pt husband they would like the labs to be done and would like for it to go to [facility] system.

## 2019-10-26 ENCOUNTER — Telehealth (HOSPITAL_COMMUNITY): Payer: Self-pay | Admitting: Psychiatry

## 2019-10-26 ENCOUNTER — Other Ambulatory Visit (HOSPITAL_COMMUNITY): Payer: Self-pay | Admitting: Psychiatry

## 2019-10-26 DIAGNOSIS — G3184 Mild cognitive impairment, so stated: Secondary | ICD-10-CM

## 2019-10-26 NOTE — Telephone Encounter (Signed)
LMOM

## 2019-10-26 NOTE — Telephone Encounter (Signed)
Labs ordered.

## 2019-10-29 NOTE — Telephone Encounter (Signed)
LMOM

## 2019-11-26 ENCOUNTER — Encounter: Payer: Self-pay | Admitting: Psychology

## 2019-12-03 ENCOUNTER — Encounter: Payer: Self-pay | Admitting: Psychology

## 2019-12-20 ENCOUNTER — Ambulatory Visit: Payer: Medicare Other | Admitting: Psychology

## 2019-12-20 ENCOUNTER — Ambulatory Visit (INDEPENDENT_AMBULATORY_CARE_PROVIDER_SITE_OTHER): Payer: Medicare Other | Admitting: Psychology

## 2019-12-20 ENCOUNTER — Other Ambulatory Visit: Payer: Self-pay

## 2019-12-20 ENCOUNTER — Telehealth (HOSPITAL_COMMUNITY): Payer: Medicare Other | Admitting: Psychiatry

## 2019-12-20 ENCOUNTER — Encounter: Payer: Self-pay | Admitting: Psychology

## 2019-12-20 DIAGNOSIS — G309 Alzheimer's disease, unspecified: Secondary | ICD-10-CM

## 2019-12-20 DIAGNOSIS — F0280 Dementia in other diseases classified elsewhere without behavioral disturbance: Secondary | ICD-10-CM

## 2019-12-20 DIAGNOSIS — R4189 Other symptoms and signs involving cognitive functions and awareness: Secondary | ICD-10-CM

## 2019-12-20 HISTORY — DX: Dementia in other diseases classified elsewhere, unspecified severity, without behavioral disturbance, psychotic disturbance, mood disturbance, and anxiety: F02.80

## 2019-12-20 NOTE — Progress Notes (Signed)
   Psychometrician Note   Cognitive testing was administered to Ariel Archer by Wallace Keller, B.S. (psychometrist) under the supervision of Dr. Newman Nickels, Ph.D., licensed psychologist on 12/20/19. Ariel Archer did not appear overtly distressed by the testing session per behavioral observation or responses across self-report questionnaires. Dr. Newman Nickels, Ph.D. checked in with Ariel Archer as needed to manage any distress related to testing procedures (if applicable). Rest breaks were offered.    The battery of tests administered was selected by Dr. Newman Nickels, Ph.D. with consideration to Ariel Archer's current level of functioning, the nature of her symptoms, emotional and behavioral responses during interview, level of literacy, observed level of motivation/effort, and the nature of the referral question. This battery was communicated to the psychometrist. Communication between Dr. Newman Nickels, Ph.D. and the psychometrist was ongoing throughout the evaluation and Dr. Newman Nickels, Ph.D. was immediately accessible at all times. Dr. Newman Nickels, Ph.D. provided supervision to the psychometrist on the date of this service to the extent necessary to assure the quality of all services provided.    Ariel Archer will return within approximately 1-2 weeks for an interactive feedback session with Dr. Milbert Coulter at which time her test performances, clinical impressions, and treatment recommendations will be reviewed in detail. Ariel Archer understands she can contact our office should she require our assistance before this time.  A total of 90 minutes of billable time were spent face-to-face with Ariel Archer by the psychometrist. This includes both test administration and scoring time. Billing for these services is reflected in the clinical report generated by Dr. Newman Nickels, Ph.D..  This note reflects time spent with the psychometrician and does not include test scores or any clinical  interpretations made by Dr. Milbert Coulter. The full report will follow in a separate note.

## 2019-12-20 NOTE — Progress Notes (Signed)
NEUROPSYCHOLOGICAL EVALUATION [place] [place] Department of Neurology  Date of Evaluation: December 20, 2019  Reason for Referral:   Ariel Archer is a 79 y.o. right-handed Caucasian female referred by Neysa Hotter, M.D., to characterize her current cognitive functioning and assist with diagnostic clarity and treatment planning in the context of subjective cognitive decline and prior history of paranoia.   Assessment and Plan:   Clinical Impression(s): Ms. Fogal pattern of performance is suggestive of prominent impairments with learning and memory, as well as semantic fluency, confrontation naming, and visuospatial abilities. Performance was generally appropriate (i.e., below average to average normative ranges) across processing speed, basic attention, cognitive flexibility, phonemic fluency, and visuoconstructional abilities. Ms. Fitzgibbons and her husband reported her exhibiting difficulties completing instrumental activities of daily living (ADLs), especially surrounding medication and financial management. She also does not drive due to cognitive concerns. This, coupled with evidence for significant cognitive dysfunction described above, suggests that she meets criteria for a Major Neurocognitive Disorder (formerly "dementia") at the present time.  Regarding etiology, I have concerns surrounding Alzheimer's disease. Ms. Kimberlin exhibited prominent memory dysfunction, was fully amnestic across all three memory tasks, and did not show any benefit from cueing. In fact, when shown her drawing of a complex figure which she made 10 minutes previously, she reported having no recollection of ever seeing or drawing that image. She additionally exhibited significant deficits surrounding confrontation naming and semantic fluency. It is important to note that language deficits could be influenced by her being tested in [place] rather than [name]. However, this overall pattern of  deficits is very consistent with Alzheimer's disease. Her psychiatrist expressed previous concerns surrounding Lewy body dementia based upon paranoia and personality changes. It is important to note that these symptoms are common in Alzheimer's disease presentations. Ms. Glantz also does not display classic symptoms of Lewy body dementia, namely fully-formed visual hallucinations, a REM sleep disorder, or ongoing parkinsonian features. As such, Lewy body dementia appears very unlikely. Continued medical monitoring will be important moving forward.    Recommendations: It does not appear that Ms. Rede is being followed by a neurologist. As such, I will make a referral for her for ongoing neurological treatment. It appears that her psychiatrist has prescribed her one medication (rivastigmine/Exelon) which is commonly given to individuals with concerns surrounding dementia. When meeting with her neurologist, Ms. Loose and her husband should discuss this medication as well as additional options. It is important to note that, while some medications may slow progression of functional decline, no current treatment has been proven to stop or reverse the effects of neurodegenerative illness.   It will be important for Ms. Tholl to have another person with her when in situations where she may need to process information, weigh the pros and cons of different options, and make decisions, in order to ensure that she fully understands and recalls all information to be considered.  If not already done, Ms. Ragsdale and her family should discuss her wishes regarding durable power of attorney and medical decision making, so that she can have input into these choices. Additionally, they may wish to discuss future plans for caretaking and seek out community options for in home/residential care should they become necessary.  I agree with her husband and further recommend that Ms. Spinnato abstain from driving due to ongoing  cognitive concerns.   Ms. Leitch is encouraged to attend to lifestyle factors for brain health (e.g., regular physical exercise, good nutrition habits, regular participation  in cognitively-stimulating activities, and general stress management techniques), which are likely to have benefits for both emotional adjustment and cognition. In fact, in addition to promoting good general health, regular exercise incorporating aerobic activities (e.g., brisk walking, jogging, cycling, etc.) has been demonstrated to be a very effective treatment for depression and stress, with similar efficacy rates to both antidepressant medication and psychotherapy. Optimal control of vascular risk factors (including safe cardiovascular exercise and adherence to dietary recommendations) is encouraged.   Important information to remember should be presented in a written format in all instances. This should be placed in a highly visible and commonly frequented area of her residence to help with recall.   To address problems with fluctuating attention, she may wish to consider:   -Avoiding external distractions when needing to concentrate   -Limiting exposure to fast paced environments with multiple sensory demands   -Writing down complicated information and using checklists   -Attempting and completing one task at a time (i.e., no multi-tasking)   -Verbalizing aloud each step of a task to maintain focus   -Taking frequent breaks during the completion of steps/tasks to avoid fatigue   -Reducing the amount of information considered at one time   -Scheduling more difficult activities for a time of day where she is usually most alert  Review of Records:   Ms. Semrad was seen by her psychiatrist Neysa Hotter, M.D.) on 10/18/2019 for follow-up of a mild neurocognitive disorder and symptoms of paranoia. At that time, Ms. Bratcher stated that she had been doing well and has remained active (i.e., she does puzzles, goes on walks, or  performs other household chores). While she did acknowledge difficulties with forgetfulness, she later denied concerns when asked to elaborate. She sleeps well and denied current psychiatric distress, including symptoms of paranoia. A history of hallucinations was further denied.   Briefly, regarding symptoms of paranoia, Ms. Hisada was admitted to [facility] from 06/04/2016-06/06/2016. At that time, records suggest that Ms. Keidel claimed that someone put a chip in her house as a joke. She further stated that she can hear her neighbors talking, with one saying "I'm not going to go over there and take it out" (in reference to the chip). She did report that there had been some friction between her and her neighbors and some of them are aggressive. She also reported hearing her neighbors saying what she is thinking and repeating things that she has said. Her husband confirmed that they do not share walls with their neighbors and that he has not heard any ongoing conversations. He also noted that she had been hearing voices and answering them back over the previous few weeks. He noted that these symptoms increased during the previous 3-4 days and he found out that Ms. Vercher had not slept during the previous three nights. He finally noted that she has been difficult to understand at times and appears to mix her words and letters while speaking.   Prior records from Dr. Vanetta Shawl dating back to 06/09/2016 also suggest delusional thoughts and paranoia. More specifically, at that time she described a neighbor named Alcario Drought who reportedly could read her mind. She believed that Alcario Drought could hear what Ms. Krus would say in the house. She believed that Alcario Drought came to [facility] previously, stating that "she was clearly reading the signs, she said this was the first floor." She was perseverative on this topic throughout this appointment with Dr. Vanetta Shawl. She also reported that she heard  Erica's  voice earlier that morning on the way to that appointment. With time and ongoing treatment, symptoms were said to subside. During the most recent appointment with Dr. Vanetta Shawl, her husband noted that irritability had additionally improved since re-starting Abilify and he denied any ongoing safety concerns.  Dr. Vanetta Shawl noted difficulties with instrumental ADLs, particularly surrounding Ms. Kovatch requiring the assistance of her husband for medication and financial management efforts. Given concerns surrounding paranoia and personality changes, Dr. Vanetta Shawl stated that symptoms "[fit] with Lewy body dementia." Ultimately, Ms. Gatz was referred for a comprehensive neuropsychological evaluation to characterize her cognitive abilities and to assist with diagnostic clarity and treatment planning.   Past Medical History:  Diagnosis Date  . Essential hypertension 06/12/2013   SBP is a bit high today. She is taking lisinopril /d.`E1o3L`IMPRESSION: recheck blood pressure at follow up  . 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 Lumbar degenerative disc disease 04/03/2016  . Lumbar radiculopathy 04/03/2016  . Major neurocognitive disorder due to Alzheimer's disease, possible 12/20/2019  . Osteopenia of multiple sites 10/03/2017   T scores: -2.3 spine,  -1.9 Fem neck,  2019  . Restless leg syndrome 08/19/2012  . 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 Slowly progressive bilateral hearing loss.  People around her are getting more more frustrated.  There is some component of dementia.  No history of ear surgery, trauma or infection. EXAMINATION shows normal external canals and tympanic membranes. AUDIOGRAM Shows moderate to severe sens  . 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   Current Outpatient Medications:  .  ARIPiprazole (ABILIFY) 5 MG tablet, Take 0.5 tablets (2.5 mg total) by mouth  daily., Disp: 45 tablet, Rfl: 0 .  L-METHYLFOLATE CALCIUM PO, Take 1 tablet by mouth daily., Disp: , Rfl:  .  lisinopril (PRINIVIL,ZESTRIL) 40 MG tablet, Take 40 mg by mouth daily., Disp: , Rfl: 3 .  Multi Vit-Fluoride-Folic Acid (MULTIVITAMIN/FLUORIDE PO), Take 0.25 mg by mouth daily., Disp: , Rfl:  .  rivastigmine (EXELON) 1.5 MG capsule, Take 1 capsule (1.5 mg total) by mouth 2 (two) times daily., Disp: 180 capsule, Rfl: 0 .  rOPINIRole (REQUIP) 1 MG tablet, Take 2 tablets by mouth daily. , Disp: , Rfl:   Clinical Interview:   The following information was obtained during a clinical interview with Ms. Haltiwanger and her husband prior to cognitive testing.  Cognitive Symptoms: Decreased short-term memory: Denied. However, her husband noted prominent memory dysfunction encompassing all areas of functioning. This included trouble recalling the details of previous conversations, trouble remembering the names of familiar individuals, and misplacing objects around her residence. Memory difficulties were said to be present for the past 1-2 years and have gradually worsened over time.  Decreased long-term memory: Denied. Decreased attention/concentration: She reported mild difficulties with sustained attention at times. Trouble with distractibility was denied.  Reduced processing speed: Denied. Difficulties with executive functions: Denied. Difficulties with emotion regulation: Denied. Difficulties with receptive language: Denied assuming she can hear the source of the sound adequately.  Difficulties with word finding: Denied. Decreased visuoperceptual ability: Denied.  Difficulties completing ADLs: Endorsed. She is reliant on her husband for medication and financial management. She also does not drive. This was at least partially attributed to ongoing cognitive deficits, especially those surrounding memory.   Additional Medical History: History of traumatic brain injury/concussion: Denied. History  of stroke: Denied. History of seizure activity: Denied. History of  known exposure to toxins: Denied. Symptoms of chronic pain: Denied. Experience of frequent headaches/migraines: Denied. Frequent instances of dizziness/vertigo: Denied.  Sensory changes: Denied. However, when reminded by her husband of the fact that she wears hearing aids and has a history of notable hearing loss, she then agreed with his assessment. Sensory changes/difficulties with vision, taste, or smell were denied.  Balance/coordination difficulties: Denied. She also denied a history of falls.  Other motor difficulties: Denied.  Sleep History: Estimated hours obtained each night: 7-8 hours. Difficulties falling asleep: Denied. Difficulties staying asleep: Denied. Feels rested and refreshed upon awakening: Endorsed.  History of snoring: Endorsed once in a while.  History of waking up gasping for air: Denied. Witnessed breath cessation while asleep: Denied.  History of vivid dreaming: Denied. Excessive movement while asleep: Denied. Instances of acting out her dreams: Denied.  Psychiatric/Behavioral Health History: Depression: Denied. She described her current mood as "good" and denied any previous mental health concerns or diagnoses. Current or remote suicidal ideation, intent, or plan was denied.  Anxiety: Denied. Mania: Denied. Trauma History: Denied. Visual/auditory hallucinations: Denied outside of potential auditory hallucinations described above linked to paranoia and delusional thinking. These symptoms abated with medication intervention and ongoing treatment.  Delusional thoughts: Endorsed (see above).   Tobacco: Denied. Alcohol: She denied current alcohol consumption as well as a history of problematic alcohol abuse or dependence.  Recreational drugs: Denied. Caffeine: 1 cup of coffee in the morning.   Family History: History reviewed. No pertinent family history. This information was confirmed by  Ms. Seals.  Academic/Vocational History: Highest level of educational attainment: 12 years. Ms. Siverling grew up in [place] prior to coming to the [place] in 1963. Her primary language is [name] and she completed her education while in [place]. English was learned in [place] after moving to the [place]. 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 No relative weaknesses were identified.  History of developmental delay: Denied. History of grade repetition: Denied. Enrollment in special education courses: Denied. History of LD/ADHD: Denied.  Employment: Retired. She previously worked in a [employer], therapeutic as a Contractor.   Evaluation Results:   Behavioral Observations: Ms. Kimble was accompanied by her husband, arrived to her appointment on time, and was appropriately dressed and groomed. She appeared alert and oriented. Observed gait and station were within normal limits. Gross motor functioning appeared intact upon informal observation and no abnormal movements (e.g., tremors) were noted. She appeared nervous during the clinical interview as she seemed to be breathing somewhat heavily and frequently wrung her hands in her lap. Spontaneous speech was generally fluent; however, her responses were very brief and limited to very short (i.e., 1-3 word) answers. She would defer to her husband at times when a more significant response was required. Hearing loss was noteworthy during the clinical interview and both questions and explanations needed to be repeated often. This was despite her wearing hearing aids. Thought processes were generally coherent, organized, and normal in content. Insight into her cognitive difficulties appeared very poor and she largely denied ongoing cognitive concerns despite objective testing revealing significant concerns. During testing, an amplification device was utilized to help with concerns surrounding hearing loss. Sustained attention was  appropriate. Task engagement was adequate and she persisted when challenged. Ms. Dement fatigued as the evaluation progressed and requested to end testing prematurely. As such, the evaluation was abbreviated in response. Overall, Ms. Brogan was cooperative with the clinical interview and subsequent testing procedures.  Adequacy of Effort: The validity of neuropsychological testing is limited by the extent to which the individual being tested may be assumed to have exerted adequate effort during testing. Ms. Gardiner CoinsBerish expressed her intention to perform to the best of her abilities and exhibited adequate task engagement and persistence. Scores across stand-alone and embedded performance validity measures were variable. However, her below expectation score is likely related to true cognitive dysfunction surrounding memory rather than poor engagement or attempts to perform poorly. As such, the results of the current evaluation are believed to be a valid representation of Ms. Southall's current cognitive functioning.  Test Results: Ms. Gardiner CoinsBerish was poorly oriented at the time of the current evaluation. She was unable to state her address, phone number, or the state she currently lives in. She was also unable to state the current year, month, date, day of the week, or her current location (including clinic name, current city, or current state).  Intellectual abilities based upon educational and vocational attainment were estimated to be in the average range. Premorbid abilities were estimated to be within the average range based upon a single-word reading test.   Processing speed was below average to average. Basic attention was below average. More complex attention (e.g., working memory) was unable to be assessed due to limited testing tolerance. Assessed cognitive flexibility was below average across a visuomotor task.  While not directly assessed, assessed receptive language abilities appeared to be relatively  intact. She did require some additional instruction across several tasks but was able to attempt them appropriately. Assessed expressive language was somewhat variable. Phonemic fluency was below average, while both semantic fluency and confrontation naming were exceptionally low.   Assessed visuospatial abilities were exceptionally low while visuoconstructional abilities were within normal limits. Points were lost on her drawing of a clock due to incorrect hand placement.    Learning (i.e., encoding) of novel verbal and visual information was exceptionally low. Spontaneous delayed recall (i.e., retrieval) of previously learned information was also exceptionally low. Retention rates were 0% across a story learning task, 0% across a list learning task, and 0% across a complex figure drawing task. Performance across recognition tasks was exceptionally low to well below average, suggesting minimal evidence for information consolidation.   Results of emotional screening instruments suggested that recent symptoms of generalized anxiety were in the minimal range, while symptoms of depression were within normal limits. A screening instrument assessing recent sleep quality suggested the presence of minimal sleep dysfunction.  Tables of Scores:   Note: This summary of test scores accompanies the interpretive report and should not be considered in isolation without reference to the appropriate sections in the text. Descriptors are based on appropriate normative data and may be adjusted based on clinical judgment. The terms "impaired" and "within normal limits (WNL)" are used when a more specific level of functioning cannot be determined.       Effort Testing:   DESCRIPTOR       Dot Counting Test: --- --- Within Expectation  RBANS Effort Index: --- --- Below Expectation       Orientation:      Raw Score Percentile   NAB Orientation, Form 1 10/29 --- ---       Cognitive Screening:           Raw Score  Percentile   SLUMS: 11/30 --- ---       RBANS, Form A: Standard Score/ Scaled Score Percentile   Total Score 54 <1 Exceptionally Low  Immediate Memory  57 <1 Exceptionally Low    List Learning 3 1 Exceptionally Low    Story Memory 3 1 Exceptionally Low  Visuospatial/Constructional 75 5 Well Below Average    Figure Copy 8 25 Average    Line Orientation 8/20 <2 Exceptionally Low  Language 60 <1 Exceptionally Low    Picture Naming 7/10 <2 Exceptionally Low    Semantic Fluency 3 1 Exceptionally Low  Attention 75 5 Well Below Average    Digit Span 6 9 Below Average    Coding 6 9 Below Average  Delayed Memory 52 <1 Exceptionally Low    List Recall 0/10 <2 Exceptionally Low    List Recognition 15/20 <2 Exceptionally Low    Story Recall 1 <1 Exceptionally Low    Story Recognition 5/12 1-4 Well Below Average    Figure Recall 1 <1 Exceptionally Low    Figure Recognition 0/8 <1 Exceptionally Low       Intellectual Functioning:           Standard Score Percentile   Test of Premorbid Functioning: 99 47 Average       Attention/Executive Function:          Trail Making Test (TMT): Raw Score (T Score) Percentile     Part A 48 secs.,  0 errors (45) 31 Average    Part B 205 secs.,  2 errors (42) 21 Below Average        Language:          Verbal Fluency Test: Raw Score (Scaled Score) Percentile     Phonemic Fluency (CFL) 19 (6) 9 Below Average    Category Fluency 12 (2) <1 Exceptionally Low  *Based on [facility] Older Normative Studies (MOANS)          NAB Language Module, Form 1: T Score Percentile     Naming 15/31 (20) <1 Exceptionally Low       Visuospatial/Visuoconstruction:      Raw Score Percentile   Clock Drawing: 8/10 --- Within Normal Limits       Mood and Personality:      Raw Score Percentile   PROMIS Depression Questionnaire: 11 --- None to Slight  PROMIS Anxiety Questionnaire: 13 --- None to Slight       Additional Questionnaires:      Raw Score Percentile   PROMIS  Sleep Disturbance Questionnaire: 10 --- None to Slight   Informed Consent and Coding/Compliance:   Ms. Bluett was provided with a verbal description of the nature and purpose of the present neuropsychological evaluation. Also reviewed were the foreseeable risks and/or discomforts and benefits of the procedure, limits of confidentiality, and mandatory reporting requirements of this provider. The patient was given the opportunity to ask questions and receive answers about the evaluation. Oral consent to participate was provided by the patient.   This evaluation was conducted by Newman Nickels, Ph.D., licensed clinical neuropsychologist. Ms. Rimmer completed a comprehensive clinical interview with Dr. Milbert Coulter, billed as one unit [phone], and 90 minutes of cognitive testing and scoring, billed as one unit [phone] and two additional units [id number]. Psychometrist Wallace Keller, B.S., assisted Dr. Milbert Coulter with test administration and scoring procedures. As a separate and discrete service, Dr. Milbert Coulter spent a total of 160 minutes in interpretation and report writing billed as one unit [phone] and two units [id number].

## 2019-12-21 ENCOUNTER — Encounter: Payer: Self-pay | Admitting: Psychology

## 2019-12-25 ENCOUNTER — Encounter: Payer: Self-pay | Admitting: Neurology

## 2019-12-27 ENCOUNTER — Encounter: Payer: Self-pay | Admitting: Psychology

## 2019-12-27 ENCOUNTER — Ambulatory Visit (INDEPENDENT_AMBULATORY_CARE_PROVIDER_SITE_OTHER): Payer: Medicare Other | Admitting: Psychology

## 2019-12-27 ENCOUNTER — Other Ambulatory Visit: Payer: Self-pay

## 2019-12-27 DIAGNOSIS — G309 Alzheimer's disease, unspecified: Secondary | ICD-10-CM

## 2019-12-27 DIAGNOSIS — F0280 Dementia in other diseases classified elsewhere without behavioral disturbance: Secondary | ICD-10-CM | POA: Diagnosis not present

## 2019-12-27 NOTE — Progress Notes (Signed)
Virtual Visit via Telephone Note  I connected with Ariel Archer on 01/03/20 at  2:30 PM EDT by telephone and verified that I am speaking with the correct person using two identifiers.  Location: Patient: home Provider: office   I discussed the limitations, risks, security and privacy concerns of performing an evaluation and management service by telephone and the availability of in person appointments. I also discussed with the patient that there may be a patient responsible charge related to this service. The patient expressed understanding and agreed to proceed.    I discussed the assessment and treatment plan with the patient. The patient was provided an opportunity to ask questions and all were answered. The patient agreed with the plan and demonstrated an understanding of the instructions.   The patient was advised to call back or seek an in-person evaluation if the symptoms worsen or if the condition fails to improve as anticipated.  I provided 12 minutes of non-face-to-face time during this encounter.   Neysa Hotter, MD    Ariel Archer Progress Note  01/03/2020 3:02 PM Ariel Archer  MRN:  [id number]  Chief Complaint:  Chief Complaint    Follow-up; Other     HPI:  - She was seen for neuropsychology evaluation. She was diagnosed with Alzheimer's disease. "Ariel Archer exhibited prominent memory dysfunction, was fully amnestic across all three memory tasks, and did not show any benefit from cueing"  This is a follow-up appointment for neurocognitive disorder.  She states that she has been doing well.  She does not recall what she did during neuropsych evaluation when she was asked.  She is at the [place] with her husband and her friends.  She cannot tell which states she is in.  Although she feels tired after driving for a few hours, she usually feels good.  She denies any issues with memory.  She sleeps well.  She has good appetite.  She denies feeling depressed.  She denies  irritability.  She denies SI, AH, VH.  She denies paranoia.   Orientation- "[birth year]."She is unable to tell the name of the city she lives in.   [name], her husband presents to the interview.  He states that Ariel Archer has been doing well.  There has been no change.  There is no episode of paranoia. The paranoia about their neighbor in the past is "no existent." He denies any safety concern. Although she takes medication regularly, she has been taking rivastigmine 1.5 mg daily (direction was BID).    Functional Status Instrumental Activities of Daily Living (IADLs):  Ariel Archer is independent in the following: cooking Requires assistance with the following: managing finances, medications [name] will put medication in pillbox)  Activities of Daily Living (ADLs):  Ariel Archer is independent in the following: bathing and hygiene, feeding, continence, grooming and toileting, walking   Employment: retired, used to work at [employer] for seven years at age 48, used to work at Teacher, adult education  Support: husband Household: husband Marital status: married Number of children: 2 Born and grew up in [place], moved to [place]. In [place], moved from [place] to [place] 7 years ago where her cousin lives.  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 Paranoia (HCC)  F22     Past Psychiatric History: Please see initial evaluation for full details. I have reviewed the history. No updates at this time.     Past Medical History:  Past Medical  History:  Diagnosis Date  . Essential hypertension 06/12/2013   SBP is a bit high today. She is taking lisinopril 10mg /d.`E1o3L`IMPRESSION: recheck blood pressure at follow up  . 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 Lumbar degenerative disc disease 04/03/2016  . Lumbar radiculopathy 04/03/2016  . Major neurocognitive disorder due to Alzheimer's disease 12/20/2019  . Osteopenia of multiple sites 10/03/2017   T scores: -2.3 spine,   -1.9 Fem neck,  2019  . Restless leg syndrome 08/19/2012  . 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 Slowly progressive bilateral hearing loss.  People around her are getting more more frustrated.  There is some component of dementia.  No history of ear surgery, trauma or infection. EXAMINATION shows normal external canals and tympanic membranes. AUDIOGRAM Shows moderate to severe sens  . 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  Family Psychiatric History: Please see initial evaluation for full details. I have reviewed the history. No updates at this time.     Family History: No family history on file.  Social History:  Social History   Socioeconomic History  . Marital status: Married    Spouse name: Not on file  . Number of children: 2  . Years of education: 49  . Highest education level: High school graduate  Occupational History  . Not on file  Tobacco Use  . Smoking status: Never Smoker  . Smokeless tobacco: Never Used  Vaping Use  . Vaping Use: Never used  Substance and Sexual Activity  . Alcohol use: No    Comment: 06-09-2016 per pt rarely  . Drug use: No  . Sexual activity: Not Currently  Other Topics Concern  . Not on file  Social History Narrative  . Not on file   Social Determinants of Health   Financial Resource Strain:   . Difficulty of Paying Living Expenses: Not on file  Food Insecurity:   . Worried About 06-11-2016 in the Last Year: Not on file  . Ran Out of Food in the Last Year: Not on file  Transportation Needs:   . Lack of Transportation (Medical): Not on file  . Lack of Transportation (Non-Medical): Not on file  Physical Activity:   . Days of Exercise per Week: Not on file  . Minutes of Exercise per Session: Not on file  Stress:   . Feeling of Stress : Not on file  Social Connections:   . Frequency of Communication with  Friends and Family: Not on file  . Frequency of Social Gatherings with Friends and Family: Not on file  . Attends Religious Services: Not on file  . Active Member of Clubs or Organizations: Not on file  . Attends Programme researcher, broadcasting/film/video Meetings: Not on file  . Marital Status: Not on file    Allergies: No Known Allergies  Metabolic Disorder Labs: Lab Results  Component Value Date   HGBA1C 5.4 06/10/2016   MPG 108 06/10/2016   No results found for: PROLACTIN Lab Results  Component Value Date   CHOL 192 06/10/2016   TRIG 106 06/10/2016   HDL 57 06/10/2016   CHOLHDL 3.4 06/10/2016   VLDL 21 06/10/2016   LDLCALC 114 (H) 06/10/2016   Lab Results  Component Value Date   TSH 1.00 06/10/2016    Therapeutic Level Labs: No results found for: LITHIUM No results found for: VALPROATE No components found for:  CBMZ  Current Medications: Current Outpatient Medications  Medication Sig Dispense Refill  . [START ON 01/15/2020] ARIPiprazole (ABILIFY) 5 MG tablet Take 0.5 tablets (2.5 mg total) by mouth daily. 45 tablet 0  . L-METHYLFOLATE CALCIUM PO Take 1 tablet by mouth daily.    Marland Kitchen. lisinopril (PRINIVIL,ZESTRIL) 40 MG tablet Take 40 mg by mouth daily.  3  . Multi Vit-Fluoride-Folic Acid (MULTIVITAMIN/FLUORIDE PO) Take 0.25 mg by mouth daily.    Melene Muller. [START ON 02/28/2020] rivastigmine (EXELON) 1.5 MG capsule Take 1 capsule (1.5 mg total) by mouth 2 (two) times daily. 180 capsule 0  . rOPINIRole (REQUIP) 1 MG tablet Take 2 tablets by mouth daily.      No current facility-administered medications for this visit.     Musculoskeletal: Strength & Muscle Tone: N/A Gait & Station: N/A Patient leans: N/A  Psychiatric Specialty Exam: Review of Systems  Psychiatric/Behavioral: Negative for agitation, behavioral problems, confusion, decreased concentration, dysphoric mood, hallucinations, self-injury, sleep disturbance and suicidal ideas. The patient is not nervous/anxious and is not hyperactive.    All other systems reviewed and are negative.   There were no vitals taken for this visit.There is no height or weight on file to calculate BMI.  General Appearance: NA  Eye Contact:  NA  Speech:  Clear and Coherent  Volume:  Normal  Mood:  good  Affect:  NA  Thought Process:  Coherent  Orientation:  Other:  oriented to self only  Thought Content: Logical   Suicidal Thoughts:  No  Homicidal Thoughts:  No  Memory:  Immediate;   Fair  Judgement:  Good  Insight:  Shallow  Psychomotor Activity:  Normal  Concentration:  Concentration: Good and Attention Span: Good  Recall:  Poor  Fund of Knowledge: Good  Language: Good  Akathisia:  No  Handed:  Right  AIMS (if indicated): not done  Assets:  Social Support  ADL's:  Intact  Cognition: WNL  Sleep:  Good   Screenings: MOCA 22/30 on 06/23/2016 (-1 for attention, -1 for language, -5 or delayed recall, -1 for orientation "April 8th") MOCA17/30 on07/30/19(-2 for visuospatial, -2 for attention, -2 for language, -1 for abstraction, -5 for delayed recall, -1 for orientation (July 23rd) ) Delayed recall 0/3, unable to tell the year, month, day, date06/22/21  Assessment and Plan:  Ariel Archer is a 79 y.o. year old female with a history of mild neurocognitive disorder, delusion,hypertension,restless leg,lumbar degenerative discdisease, who presents for follow up appointment for below.   1. Mild neurocognitive disorder 2. Paranoia (HCC) There has been no significant paranoia or hallucinations since the last visit.  She had neuropsych evaluation, and the result was suggestive of Alzheimer's disease, although the  clinical course was more suggestive of Lewy body dementia given fluctuation of her symptoms and hallucinations.  While reviewing her medication, the patient has been taking rivastigmine only once a day.  Will uptitrate the dose to optimize treatment for neurocognitive disorder.  We will continue Abilify to target irritability.   Discussed potential metabolic side effect and EPS.    Plan I have reviewed and updated plans as below 1.IncreaseRivastigmine 1.5 mg twice a day  2.Continue Abilify 2.5 mg daily 3.Next appointment: 1/6 at 2:30 for 30 mins, video - She has an upcoming appointment with neurologist - Folate, TSH, vitamin B 12 was ordered, but they have not checked them yet  The patient demonstrates the following risk factors for suicide: Chronic risk factors for suicide include: psychiatric disorder of paranoia. Acute risk factorsfor suicide include:  unemployment and recent discharge from inpatient psychiatry. Protective factorsfor this patient include: positive social support, coping skills and hope for the future. Considering these factors, the overall suicide risk at this point appears to be low. Patient isappropriate for outpatient follow up.  Neysa Hotter, MD 01/03/2020, 3:02 PM

## 2019-12-27 NOTE — Patient Instructions (Signed)
It does not appear that Ariel Archer is being followed by a neurologist. As such, I will make a referral for Ariel for ongoing neurological treatment. It appears that Ariel psychiatrist has prescribed Ariel one medication (rivastigmine/Exelon) which is commonly given to individuals with concerns surrounding dementia. When meeting with Ariel neurologist, Ariel Archer and Ariel Archer should discuss this medication as well as additional options. It is important to note that, while some medications may slow progression of functional decline, no current treatment has been proven to stop or reverse the effects of neurodegenerative illness.   It will be important for Ariel Archer to have another person with Ariel when in situations where she may need to process information, weigh the pros and cons of different options, and make decisions, in order to ensure that she fully understands and recalls all information to be considered.  If not already done, Ariel Archer and Ariel family should discuss Ariel wishes regarding durable power of attorney and medical decision making, so that she can have input into these choices. Additionally, they may wish to discuss future plans for caretaking and seek out community options for in home/residential care should they become necessary.  I agree with Ariel Archer and further recommend that Ariel Archer abstain from driving due to ongoing cognitive concerns.   Ariel Archer is encouraged to attend to lifestyle factors for brain health (e.g., regular physical exercise, good nutrition habits, regular participation in cognitively-stimulating activities, and general stress management techniques), which are likely to have benefits for both emotional adjustment and cognition. In fact, in addition to promoting good general health, regular exercise incorporating aerobic activities (e.g., brisk walking, jogging, cycling, etc.) has been demonstrated to be a very effective treatment for depression and stress, with  similar efficacy rates to both antidepressant medication and psychotherapy. Optimal control of vascular risk factors (including safe cardiovascular exercise and adherence to dietary recommendations) is encouraged.   Important information to remember should be presented in a written format in all instances. This should be placed in a highly visible and commonly frequented area of Ariel residence to help with recall.   To address problems with fluctuating attention, she may wish to consider:   -Avoiding external distractions when needing to concentrate   -Limiting exposure to fast paced environments with multiple sensory demands   -Writing down complicated information and using checklists   -Attempting and completing one task at a time (i.e., no multi-tasking)   -Verbalizing aloud each step of a task to maintain focus   -Taking frequent breaks during the completion of steps/tasks to avoid fatigue   -Reducing the amount of information considered at one time   -Scheduling more difficult activities for a time of day where she is usually most alert

## 2019-12-27 NOTE — Progress Notes (Signed)
° °  Neuropsychology Feedback Session Eligha Bridegroom. [facility] Department of Neurology  Reason for Referral:   Graci Berishis a 79 y.o. right-handed Caucasian female referred by Neysa Hotter, M.D.,to characterize hercurrent cognitive functioning and assist with diagnostic clarity and treatment planning in the context of subjective cognitive decline and prior history of paranoia.   Feedback:   Ms. Reza completed a comprehensive neuropsychological evaluation on 12/20/2019. Please refer to that encounter for the full report and recommendations. Briefly, results suggested prominent impairments with learning and memory, as well as semantic fluency, confrontation naming, and visuospatial abilities. Regarding etiology, I have concerns surrounding Alzheimer's disease. Ms. Hoot exhibited prominent memory dysfunction, was fully amnestic across all three memory tasks, and did not show any benefit from cueing. In fact, when shown her drawing of a complex figure which she made 10 minutes previously, she reported having no recollection of ever seeing or drawing that image. She additionally exhibited significant deficits surrounding confrontation naming and semantic fluency. It is important to note that language deficits could be influenced by her being tested in [place] rather than [name]. However, this overall pattern of deficits is very consistent with Alzheimer's disease.   Ms. Rosero was accompanied by her husband during the current feedback appointment. Content of the current session focused on the results of her neuropsychological evaluation. Ms. Szczesny was adamant that she had "never been here before" and had no recollection of completing testing the week before. Her husband was given the opportunity to ask questions and his questions were answered. They were encouraged to reach out should additional questions arise. A copy of her report was provided at the conclusion of the visit. Mr.  Mellin also requested that I mail him information regarding support groups in the area.      17 minutes were spent conducting the current feedback session with Ms. Postma, billed as one unit [phone].

## 2020-01-03 ENCOUNTER — Telehealth (INDEPENDENT_AMBULATORY_CARE_PROVIDER_SITE_OTHER): Payer: Medicare Other | Admitting: Psychiatry

## 2020-01-03 ENCOUNTER — Other Ambulatory Visit: Payer: Self-pay

## 2020-01-03 ENCOUNTER — Encounter (HOSPITAL_COMMUNITY): Payer: Self-pay | Admitting: Psychiatry

## 2020-01-03 DIAGNOSIS — G3184 Mild cognitive impairment, so stated: Secondary | ICD-10-CM

## 2020-01-03 DIAGNOSIS — F22 Delusional disorders: Secondary | ICD-10-CM

## 2020-01-03 MED ORDER — ARIPIPRAZOLE 5 MG PO TABS: 2.5000 mg | ORAL_TABLET | Freq: Every day | ORAL | 0 refills | Status: DC

## 2020-01-03 MED ORDER — RIVASTIGMINE TARTRATE 1.5 MG PO CAPS: 1.5000 mg | ORAL_CAPSULE | Freq: Two times a day (BID) | ORAL | 0 refills | Status: DC

## 2020-01-03 NOTE — Patient Instructions (Signed)
1.IncreaseRivastigmine 1.5 mg twice a day  2.Continue Abilify 2.5 mg daily 3.Next appointment: 1/6 at 2:30

## 2020-01-14 ENCOUNTER — Ambulatory Visit: Payer: Medicare Other | Admitting: Neurology

## 2020-01-14 ENCOUNTER — Other Ambulatory Visit: Payer: Self-pay

## 2020-01-14 ENCOUNTER — Encounter: Payer: Self-pay | Admitting: Neurology

## 2020-01-14 VITALS — BP 177/77 | HR 60 | Ht 65.0 in | Wt 143.8 lb

## 2020-01-14 DIAGNOSIS — G309 Alzheimer's disease, unspecified: Secondary | ICD-10-CM | POA: Diagnosis not present

## 2020-01-14 DIAGNOSIS — F0280 Dementia in other diseases classified elsewhere without behavioral disturbance: Secondary | ICD-10-CM | POA: Diagnosis not present

## 2020-01-14 MED ORDER — RIVASTIGMINE TARTRATE 3 MG PO CAPS: 3.0000 mg | ORAL_CAPSULE | Freq: Two times a day (BID) | ORAL | 3 refills | Status: DC

## 2020-01-14 NOTE — Patient Instructions (Signed)
Great to meet you!  1. Increase Rivastigmine to 3mg  twice a day. With your current prescription of Rivastigmine 1.5mg : take 2 capsules twice a day. Once done, your new bottle will be for Rivastigmine 3mg : Take 1 capsule twice a day  2. Follow-up in 6-8 months, call for any changes   FALL PRECAUTIONS: Be cautious when walking. Scan the area for obstacles that may increase the risk of trips and falls. When getting up in the mornings, sit up at the edge of the bed for a few minutes before getting out of bed. Consider elevating the bed at the head end to avoid drop of blood pressure when getting up. Walk always in a well-lit room (use night lights in the walls). Avoid area rugs or power cords from appliances in the middle of the walkways. Use a walker or a cane if necessary and consider physical therapy for balance exercise. Get your eyesight checked regularly.  HOME SAFETY: Consider the safety of the kitchen when operating appliances like stoves, microwave oven, and blender. Consider having supervision and share cooking responsibilities until no longer able to participate in those. Accidents with firearms and other hazards in the house should be identified and addressed as well.  ABILITY TO BE LEFT ALONE: If patient is unable to contact 911 operator, consider using LifeLine, or when the need is there, arrange for someone to stay with patients. Smoking is a fire hazard, consider supervision or cessation. Risk of wandering should be assessed by caregiver and if detected at any point, supervision and safe proof recommendations should be instituted.  MEDICATION SUPERVISION: Inability to self-administer medication needs to be constantly addressed. Implement a mechanism to ensure safe administration of the medications.  RECOMMENDATIONS FOR ALL PATIENTS WITH MEMORY PROBLEMS: 1. Continue to exercise (Recommend 30 minutes of walking everyday, or 3 hours every week) 2. Increase social interactions - continue  going to [place] and enjoy social gatherings with friends and family 3. Eat healthy, avoid fried foods and eat more fruits and vegetables 4. Maintain adequate blood pressure, blood sugar, and blood cholesterol level. Reducing the risk of stroke and cardiovascular disease also helps promoting better memory. 5. Avoid stressful situations. Live a simple life and avoid aggravations. Organize your time and prepare for the next day in anticipation. 6. Sleep well, avoid any interruptions of sleep and avoid any distractions in the bedroom that may interfere with adequate sleep quality 7. Avoid sugar, avoid sweets as there is a strong link between excessive sugar intake, diabetes, and cognitive impairment We discussed the Mediterranean diet, which has been shown to help patients reduce the risk of progressive memory disorders and reduces cardiovascular risk. This includes eating fish, eat fruits and green leafy vegetables, nuts like almonds and hazelnuts, walnuts, and also use olive oil. Avoid fast foods and fried foods as much as possible. Avoid sweets and sugar as sugar use has been linked to worsening of memory function.  There is always a concern of gradual progression of memory problems. If this is the case, then we may need to adjust level of care according to patient needs. Support, both to the patient and caregiver, should then be put into place.

## 2020-01-14 NOTE — Progress Notes (Signed)
NEUROLOGY CONSULTATION NOTE  Ariel Archer MRN: [id number] DOB: May 20, 1940  Referring provider: Dr. Hazle Coca Primary care provider: Dr. Everlene Balls  Reason for consult:  Alzheimer's disease  Dear Dr Melvyn Novas:  Thank you for your kind referral of Ariel Archer for consultation of the above symptoms. Although her history is well known to you, please allow me to reiterate it for the purpose of our medical record. The patient was accompanied to the clinic by her husband who also provides collateral information. Records and images were personally reviewed where available.   HISTORY OF PRESENT ILLNESS: This is an 79 year old right-handed woman with a history of hypertension, RLS, depression, presenting for evaluation of dementia. She is accompanied by her husband who helps supplement the history today. Records from her recent Neuropsychological evaluation done 12/2019 were reviewed. Her husband states symptoms started around 2018 when she started having paranoia about a neighbor. She was evaluated by Dr. Modesta Messing with Psychiatry with a MOCA score of 22/30. She had an MRI brain without contrast in 2018 which I personally reviewed, no acute changes, there is mild diffuse atrophy. She states "I don't forget things." Her husband started noticing changes a year ago, she was forgetful, she would not remember events that happened such as their trips. Once she started Abilify a year ago, the paranoia resolved. She states she does not take any medications. Her husband manages medications and finances. She says she drives and denies getting lost, however her husband reports that he does the driving. She denies misplacing things, he reports that she is organized. She is independent with dressing and bathing. Her husband denies any further paranoia, no hallucinations. Sleep is good, no REM behavior disorder. She states her mood is good. She likes doing 1000-piece puzzles, playing cards, walking daily. There is no  family history of dementia, no history of significant concussions. She very seldom drinks alcohol. She denies any headaches, dizziness, diplopia, dysarthria/dysphagia, neck/back pain, focal numbness/tingling/weakness, anosmia. She is "a little bit shaky."   Neuropsychological evaluation in October 2021 indicated prominent impairments with learning and memory, as well as semantic fluency, confrontation naming, and visuospatial abilities. She met criteria for Major Neurocognitive Disorder, etiology concerning for Alzheimer's disease. Due to initial presentation of paranoia, concern for Lewy body dementia had been raised, however she did not display classic features and this was felt to be very unlikely.    PAST MEDICAL HISTORY: Past Medical History:  Diagnosis Date  . Essential hypertension 06/12/2013   SBP is a bit high today. She is taking lisinopril 24m/d.`E1o3L`IMPRESSION: recheck blood pressure at follow up  . 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 Lumbar degenerative disc disease 04/03/2016  . Lumbar radiculopathy 04/03/2016  . Major neurocognitive disorder due to Alzheimer's disease 12/20/2019  . Osteopenia of multiple sites 10/03/2017   T scores: -2.3 spine,  -1.9 Fem neck,  2019  . Restless leg syndrome 08/19/2012  . 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 Slowly progressive bilateral hearing loss.  People around her are getting more more frustrated.  There is some component of dementia.  No history of ear surgery, trauma or infection. EXAMINATION shows normal external canals and tympanic membranes. AUDIOGRAM Shows moderate to severe sens  . Spondylosis of lumbar region without myelopathy or radiculopathy 04/03/2016    PAST SURGICAL HISTORY: No past surgical history on file.  MEDICATIONS: Current Outpatient Medications on File Prior to Visit  Medication Sig Dispense Refill  . [START ON  01/15/2020] ARIPiprazole (ABILIFY) 5 MG tablet Take  0.5 tablets (2.5 mg total) by mouth daily. 45 tablet 0  . L-METHYLFOLATE CALCIUM PO Take 1 tablet by mouth daily.    Marland Kitchen lisinopril (PRINIVIL,ZESTRIL) 40 MG tablet Take 40 mg by mouth daily.  3  . Multi Vit-Fluoride-Folic Acid (MULTIVITAMIN/FLUORIDE PO) Take 0.25 mg by mouth daily.    Derrill Memo ON 02/28/2020] rivastigmine (EXELON) 1.5 MG capsule Take 1 capsule (1.5 mg total) by mouth 2 (two) times daily. 180 capsule 0  . rOPINIRole (REQUIP) 1 MG tablet Take 2 tablets by mouth daily.      No current facility-administered medications on file prior to visit.    ALLERGIES: No Known Allergies  FAMILY HISTORY: No family history on file.  SOCIAL HISTORY: Social History   Socioeconomic History  . Marital status: Married    Spouse name: Not on file  . Number of children: 2  . Years of education: 72  . Highest education level: High school graduate  Occupational History  . Not on file  Tobacco Use  . Smoking status: Never Smoker  . Smokeless tobacco: Never Used  Vaping Use  . Vaping Use: Never used  Substance and Sexual Activity  . Alcohol use: No    Comment: 06-09-2016 per pt rarely  . Drug use: No  . Sexual activity: Not Currently  Other Topics Concern  . Not on file  Social History Narrative  . Not on file   Social Determinants of Health   Financial Resource Strain:   . Difficulty of Paying Living Expenses: Not on file  Food Insecurity:   . Worried About Charity fundraiser in the Last Year: Not on file  . Ran Out of Food in the Last Year: Not on file  Transportation Needs:   . Lack of Transportation (Medical): Not on file  . Lack of Transportation (Non-Medical): Not on file  Physical Activity:   . Days of Exercise per Week: Not on file  . Minutes of Exercise per Session: Not on file  Stress:   . Feeling of Stress : Not on file  Social Connections:   . Frequency of Communication with Friends and Family: Not on file  . Frequency of Social Gatherings with Friends and  Family: Not on file  . Attends Religious Services: Not on file  . Active Member of Clubs or Organizations: Not on file  . Attends Archivist Meetings: Not on file  . Marital Status: Not on file  Intimate Partner Violence:   . Fear of Current or Ex-Partner: Not on file  . Emotionally Abused: Not on file  . Physically Abused: Not on file  . Sexually Abused: Not on file     PHYSICAL EXAM: Vitals:   01/14/20 1259  BP: (!) 177/77  Pulse: 60  SpO2: 98%   General: No acute distress Head:  Normocephalic/atraumatic Skin/Extremities: No rash, no edema Neurological Exam: Mental status: alert and oriented to person, place, and time, no dysarthria or aphasia, Fund of knowledge is reduced.  Recent and remote memory are impaired.  Attention and concentration are normal.    Cranial nerves: CN I: not tested CN II: pupils equal, round and reactive to light, visual fields intact CN III, IV, VI:  full range of motion, no nystagmus, no ptosis CN V: facial sensation intact CN VII: upper and lower face symmetric CN VIII: hearing intact to conversation Bulk & Tone: normal, no fasciculations. Motor: 5/5 throughout with no pronator drift. Sensation: intact  to light touch, cold, pin, vibration sense.  No extinction to double simultaneous stimulation.  Romberg test negative Deep Tendon Reflexes: +2 throughout, no ankle clonus Plantar responses: downgoing bilaterally Cerebellar: no incoordination on finger to nose testing Gait: narrow-based and steady, able to tandem walk adequately. Tremor: none   IMPRESSION: This is an 79 year old right-handed woman with a history of hypertension, RLS, depression, presenting for evaluation of dementia. Recent Neuropsychological evaluation indicated dementia, likely due to Alzheimer's disease. She had paranoia and personality changes in 2018, however cognitive profile was felt more consistent with AD rather than Lewy body dementia. MRI brain no acute  changes, there is mild diffuse atrophy. She has minimal insight into her condition and feels memory is good. Results discussed today, we agreed to increase Rivastigmine to 40m BID. Continue control of vascular risk factors, physical exercise, and brain stimulation exercises for brain health. Continue close supervision, she does not drive. Follow-up in 6-8 months, they know to call for any changes.   Thank you for allowing me to participate in the care of this patient. Please do not hesitate to call for any questions or concerns.   KEllouise Newer M.D.  CC: Dr. MMelvyn Novas Dr. PDianah Field Dr. HModesta Messing

## 2020-02-13 ENCOUNTER — Telehealth: Payer: Self-pay

## 2020-02-13 NOTE — Telephone Encounter (Signed)
Faxed and confirmed a message sent to pharmacy in regards to medication refill.

## 2020-02-13 NOTE — Telephone Encounter (Signed)
  received a fax requesting a refill on aripiorazole.  pt last seen on  10-21 next appt 03-20-20  ARIPiprazole (ABILIFY) 5 MG tablet Medication Date: 01/03/2020 Department: [facility] Ordering/Authorizing: Neysa Hotter, MD  Order Providers  Prescribing Provider Encounter Provider  Neysa Hotter, MD Neysa Hotter, MD  Outpatient Medication Detail   Disp Refills Start End   ARIPiprazole (ABILIFY) 5 MG tablet 45 tablet 0 01/15/2020    Sig - Route: Take 0.5 tablets (2.5 mg total) by mouth daily. - Oral   Sent to pharmacy as: ARIPiprazole (ABILIFY) 5 MG tablet   E-Prescribing Status: Receipt confirmed by pharmacy (01/03/2020 2:54 PM EDT)   Pharmacy  [id number] - [place], [place] - [address] AT [facility] [address]

## 2020-02-13 NOTE — Telephone Encounter (Signed)
The instruction was to take half dose- so she should have enough meds until the next visit.

## 2020-02-18 ENCOUNTER — Telehealth: Payer: Self-pay

## 2020-02-18 NOTE — Telephone Encounter (Signed)
It should last for 90 days as the instruction is to take half dose

## 2020-02-18 NOTE — Telephone Encounter (Signed)
Received a fax requesting a refill on the aripiorazole.  Pt last seen on 12-20-19 next appt  03-20-20   ARIPiprazole (ABILIFY) 5 MG tablet Medication Date: 01/03/2020 Department: [facility] Ordering/Authorizing: Neysa Hotter, MD  Order Providers  Prescribing Provider Encounter Provider  Neysa Hotter, MD Neysa Hotter, MD  Outpatient Medication Detail   Disp Refills Start End   ARIPiprazole (ABILIFY) 5 MG tablet 45 tablet 0 01/15/2020    Sig - Route: Take 0.5 tablets (2.5 mg total) by mouth daily. - Oral   Sent to pharmacy as: ARIPiprazole (ABILIFY) 5 MG tablet   E-Prescribing Status: Receipt confirmed by pharmacy (01/03/2020 2:54 PM EDT)   Pharmacy  [id number] - [place], [place] - [address] AT [facility] [address]

## 2020-02-21 ENCOUNTER — Other Ambulatory Visit: Payer: Self-pay | Admitting: Psychiatry

## 2020-02-21 MED ORDER — ARIPIPRAZOLE 5 MG PO TABS
2.5000 mg | ORAL_TABLET | Freq: Every day | ORAL | 0 refills | Status: DC
Start: 2020-02-21 — End: 2020-06-17

## 2020-03-06 ENCOUNTER — Ambulatory Visit: Payer: Medicare Other | Admitting: Neurology

## 2020-03-06 NOTE — Progress Notes (Signed)
Virtual Visit via Video Note  I connected with Ariel Archer on 03/20/20 at  2:40 PM EST by a video enabled telemedicine application and verified that I am speaking with the correct person using two identifiers.  Location: Patient: home Provider: office Persons participated in the visit- patient, provider   I discussed the limitations of evaluation and management by telemedicine and the availability of in person appointments. The patient expressed understanding and agreed to proceed.     I discussed the assessment and treatment plan with the patient. The patient was provided an opportunity to ask questions and all were answered. The patient agreed with the plan and demonstrated an understanding of the instructions.   The patient was advised to call back or seek an in-person evaluation if the symptoms worsen or if the condition fails to improve as anticipated.  I provided 13 minutes of non-face-to-face time during this encounter.   Norman Clay, MD     [facility] MD/PA/NP OP Progress Note  03/20/2020 2:54 PM Ariel Archer  MRN:  [id number]  Chief Complaint:  Chief Complaint    Follow-up; Other; Paranoid     HPI:  - According to the chart review, she was seen by Dr. Delice Archer.   This is a follow-up appointment for neurocognitive disorder and paranoia.  She states that she has been doing "okay." She had "good" holidays. When she was asked which family member she met on holidays, she answered "okay." When she is asked where her children live, she answers "don't know." She enjoys doing puzzles and walking.  She denies any concerns at this time.  She sleeps well.  She has good appetite.  She denies paranoia.  She denies AH, VH.    [name], her husband presents to the interview.  He states that there has been no change since the last visit except that rivastigmine was up titrated by her neurologist.  [name] does not have any concern about the patient, again stating that there has been no change,  "no better."  [name] is aware of Ariel Archer's diagnosis. No change in her ADL/IADL as below, or change in her memory. He denies safety concern.    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 In 1966, moved from [place] to [place] 7 years ago where her cousin lives.   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 I have reviewed the history. No updates at this time.     Past Medical History:  Past Medical History:  Diagnosis Date  . Essential hypertension 06/12/2013   SBP is a bit high today. She is taking lisinopril 105m/d.`E1o3L`IMPRESSION: recheck blood pressure at follow up  . 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 Lumbar degenerative disc disease 04/03/2016  . Lumbar radiculopathy 04/03/2016  . Major neurocognitive disorder due to Alzheimer's disease 12/20/2019  . Osteopenia of multiple sites 10/03/2017  T scores: -2.3 spine,  -1.9 Fem neck,  2019  . Restless leg syndrome 08/19/2012  . 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 Slowly progressive bilateral hearing loss.  People around her are getting more more frustrated.  There is some component of dementia.  No history of ear surgery, trauma or infection. EXAMINATION shows normal  external canals and tympanic membranes. AUDIOGRAM Shows moderate to severe sens  . 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  Family Psychiatric History: Please see initial evaluation for full details. I have reviewed the history. No updates at this time.     Family History: No family history on file.  Social History:  Social History   Socioeconomic History  . Marital status: Married    Spouse name: Not on file  . Number of children: 2  . Years of education: 64  . Highest education level: High school graduate  Occupational History  . Not on file  Tobacco Use  . Smoking status: Never Smoker  . Smokeless tobacco: Never Used  Vaping Use  . Vaping Use: Never used  Substance and Sexual Activity  . Alcohol use: No    Comment: 06-09-2016 per pt rarely  . Drug use: No  . Sexual activity: Not Currently  Other Topics Concern  . Not on file  Social History Narrative   Right handed    Lives with husband   Social Determinants of Health   Financial Resource Strain: Not on file  Food Insecurity: Not on file  Transportation Needs: Not on file  Physical Activity: Not on file  Stress: Not on file  Social Connections: Not on file    Allergies: No Known Allergies  Metabolic Disorder Labs: Lab Results  Component Value Date   HGBA1C 5.4 06/10/2016   MPG 108 06/10/2016   No results found for: PROLACTIN Lab Results  Component Value Date   CHOL 192 06/10/2016   TRIG 106 06/10/2016   HDL 57 06/10/2016   CHOLHDL 3.4 06/10/2016   VLDL 21 06/10/2016   LDLCALC 114 (H) 06/10/2016   Lab Results  Component Value Date   TSH 1.00 06/10/2016    Therapeutic Level Labs: No results found for: LITHIUM No results found for: VALPROATE No components found for:  CBMZ  Current Medications: Current Outpatient Medications  Medication Sig Dispense Refill  . ARIPiprazole (ABILIFY) 5 MG tablet Take 0.5 tablets (2.5 mg  total) by mouth daily. 45 tablet 0  . CRANBERRY PO Take 650 mg by mouth daily.    . L-METHYLFOLATE CALCIUM PO Take 1 tablet by mouth daily.    Marland Kitchen lisinopril (PRINIVIL,ZESTRIL) 40 MG tablet Take 40 mg by mouth daily.  3  . Multi Vit-Fluoride-Folic Acid (MULTIVITAMIN/FLUORIDE PO) Take 0.25 mg by mouth daily.    . rivastigmine (EXELON) 3 MG capsule Take 1 capsule (3 mg total) by mouth 2 (two) times daily. 180 capsule 3  . rOPINIRole (REQUIP) 1 MG tablet Take 2 tablets by mouth daily.      No current facility-administered medications for this visit.     Musculoskeletal: Strength & Muscle Tone: N/A Gait & Station: N/A Patient leans: N/A  Psychiatric Specialty Exam: Review of Systems  Psychiatric/Behavioral: Negative for agitation, behavioral problems, confusion, decreased concentration, dysphoric mood, hallucinations, self-injury, sleep disturbance and suicidal ideas. The patient is not nervous/anxious and is not hyperactive.   All other systems reviewed and are negative.  There were no vitals taken for this visit.There is no height or weight on file to calculate BMI.  General Appearance: Fairly Groomed  Eye Contact:  Good  Speech:  Clear and Coherent  Volume:  Normal  Mood:  good  Affect:  Appropriate and Blunt  Thought Process:  Coherent  Orientation:  Full (Time, Place, and Person)  Thought Content: Logical   Suicidal Thoughts:  No  Homicidal Thoughts:  No  Memory:  Immediate;   Good  Judgement:  Good  Insight:  Present  Psychomotor Activity:  Normal  Concentration:  Concentration: Good and Attention Span: Good  Recall:  Good  Fund of Knowledge: Good  Language: Good  Akathisia:  No  Handed:  Right  AIMS (if indicated): not done  Assets:  Communication Skills Desire for Improvement  ADL's:  Intact  Cognition: WNL  Sleep:  Good   Screenings:    MOCA 22/30 on 06/23/2016 (-1 for attention, -1 for language, -5 or delayed recall, -1 for orientation "April  8th") MOCA17/30 on07/30/19(-2 for visuospatial, -2 for attention, -2 for language, -1 for abstraction, -5 for delayed recall, -1 for orientation (July 23rd) ) Delayed recall 0/3, unable to tell the year, month, day, date06/22/21   Assessment and Plan:  Ariel Archer is a 79 y.o. year old female with a history of  mild neurocognitive disorder, delusion,hypertension,restless leg,lumbar degenerative discdisease, who presents for follow up appointment for below.   1. Mild neurocognitive disorder 2. 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  There has been no known significant paranoia since the last visit.  Will continue current dose of rivastigmine for neurocognitive disorder while monitoring bradycardia.  Will continue Abilify to target paranoia given she had worsening in irritability when we tried to taper off this medication.  Discussed potential risk of EPS, metabolic side effects. Although it is preferable to avoid antipsychotics with patient with neurocognitive disorder due to increased risk of mortality, the benefit of treating paranoia.irritability outweighs risk. The patient, and her husband is in agreement with plans.   Plan I have reviewed and updated plans as below 1. 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 Acute risk factorsfor suicide include: unemployment and recent discharge from inpatient psychiatry. Protective factorsfor this patient include: positive social support, coping skills and hope for  the future. Considering these factors, the overall suicide risk at this point appears to be low. Patient isappropriate for outpatient follow up. Norman Clay, MD 03/20/2020, 2:54 PM

## 2020-03-20 ENCOUNTER — Encounter: Payer: Self-pay | Admitting: Psychiatry

## 2020-03-20 ENCOUNTER — Telehealth (INDEPENDENT_AMBULATORY_CARE_PROVIDER_SITE_OTHER): Payer: Medicare Other | Admitting: Psychiatry

## 2020-03-20 ENCOUNTER — Telehealth (HOSPITAL_COMMUNITY): Payer: Medicare Other | Admitting: Psychiatry

## 2020-03-20 ENCOUNTER — Other Ambulatory Visit: Payer: Self-pay

## 2020-03-20 DIAGNOSIS — G3184 Mild cognitive impairment, so stated: Secondary | ICD-10-CM | POA: Diagnosis not present

## 2020-03-20 DIAGNOSIS — F22 Delusional disorders: Secondary | ICD-10-CM | POA: Diagnosis not present

## 2020-03-20 NOTE — Patient Instructions (Signed)
1. Continue Rivastigmine twice a day  2.Continue Abilify 2.5 mg daily 3.Next appointment: 4/5 at 1 PM

## 2020-03-25 ENCOUNTER — Telehealth: Payer: Self-pay | Admitting: Psychiatry

## 2020-03-25 NOTE — Telephone Encounter (Signed)
Left voice message in regards to the consent form to release her information to [name], her husband. This form will be mailed to the patient.

## 2020-06-12 NOTE — Progress Notes (Signed)
Virtual Visit via Telephone Note  I connected with Ariel Archer on 06/17/20 at  1:00 PM EDT by telephone and verified that I am speaking with the correct person using two identifiers.  Location: Patient: home Provider: office Persons participated in the visit- patient, provider   I discussed the limitations, risks, security and privacy concerns of performing an evaluation and management service by telephone and the availability of in person appointments. I also discussed with the patient that there may be a patient responsible charge related to this service. The patient expressed understanding and agreed to proceed.  I discussed the assessment and treatment plan with the patient. The patient was provided an opportunity to ask questions and all were answered. The patient agreed with the plan and demonstrated an understanding of the instructions.   The patient was advised to call back or seek an in-person evaluation if the symptoms worsen or if the condition fails to improve as anticipated.  I provided 11 minutes of non-face-to-face time during this encounter.   Ariel Clay, MD    [facility] MD/PA/NP OP Progress Note  06/17/2020 1:29 PM Ariel Archer  MRN:  [id number]  Chief Complaint:  Chief Complaint    Other; Follow-up     HPI:  This is a follow-up appointment for neurocognitive disorder and paranoia.  She states that she has been doing well.  She enjoys playing cards.  She takes a walk, and watch TV in the afternoon.  When she is asked about her children, she answers that "they are not here."  She cannot recall when was the last time she met them. She denies any paranoia.  She denies hallucinations.  She denies feeling depressed.  She denies anxiety. She has good appetite. She denies insomnia.   Ariel Archer, her husband presents to the interview.  He provided majority of the history.  He states that also has been doing well.  There have "good life "and denies any significant change since the  last visit.  He does not think medication adjustment is needed at this time, wants to stay the same.  He denies any safety concern.   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 In 1966, moved from [place] to [place] 7 years ago where her cousin lives.  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 I have reviewed the history. No updates at this time.     Past Medical History:  Past Medical History:  Diagnosis Date  . Essential hypertension 06/12/2013   SBP is a bit high today. She is taking lisinopril $RemoveBeforeDEI'10mg'qMMoZbEpwwXslpMp$ /d.`E1o3L`IMPRESSION: recheck blood pressure at follow up  . 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 Lumbar degenerative disc disease 04/03/2016  . Lumbar radiculopathy 04/03/2016  . Major neurocognitive disorder due to Alzheimer's disease 12/20/2019  . Osteopenia of multiple sites 10/03/2017   T scores: -2.3 spine,  -1.9 Fem neck,  2019  . Restless  leg syndrome 08/19/2012  . 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 Slowly progressive bilateral hearing loss.  People around her are getting more more frustrated.  There is some component of dementia.  No history of ear surgery, trauma or infection. EXAMINATION shows normal external  canals and tympanic membranes. AUDIOGRAM Shows moderate to severe sens  . 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  Family Psychiatric History: Please see initial evaluation for full details. I have reviewed the history. No updates at this time.     Family History: No family history on file.  Social History:  Social History   Socioeconomic History  . Marital status: Married    Spouse name: Not on file  . Number of children: 2  . Years of education: 13  . Highest education level: High school graduate  Occupational History  . Not on file  Tobacco Use  . Smoking status: Never Smoker  . Smokeless tobacco: Never Used  Vaping Use  . Vaping Use: Never used  Substance and Sexual Activity  . Alcohol use: No    Comment: 06-09-2016 per pt rarely  . Drug use: No  . Sexual activity: Not Currently  Other Topics Concern  . Not on file  Social History Narrative   Right handed    Lives with husband   Social Determinants of Health   Financial Resource Strain: Not on file  Food Insecurity: Not on file  Transportation Needs: Not on file  Physical Activity: Not on file  Stress: Not on file  Social Connections: Not on file    Allergies: No Known Allergies  Metabolic Disorder Labs: Lab Results  Component Value Date   HGBA1C 5.4 06/10/2016   MPG 108 06/10/2016   No results found for: PROLACTIN Lab Results  Component Value Date   CHOL 192 06/10/2016   TRIG 106 06/10/2016   HDL 57 06/10/2016   CHOLHDL 3.4 06/10/2016   VLDL 21 06/10/2016   LDLCALC 114 (H) 06/10/2016   Lab Results  Component Value Date   TSH 1.00 06/10/2016    Therapeutic Level Labs: No results found for: LITHIUM No results found for: VALPROATE No components found for:  CBMZ  Current Medications: Current Outpatient Medications  Medication Sig Dispense Refill  . ARIPiprazole (ABILIFY) 5 MG tablet Take 0.5 tablets (2.5 mg total) by  mouth daily. 45 tablet 0  . CRANBERRY PO Take 650 mg by mouth daily.    . L-METHYLFOLATE CALCIUM PO Take 1 tablet by mouth daily.    Marland Kitchen lisinopril (PRINIVIL,ZESTRIL) 40 MG tablet Take 40 mg by mouth daily.  3  . Multi Vit-Fluoride-Folic Acid (MULTIVITAMIN/FLUORIDE PO) Take 0.25 mg by mouth daily.    . rivastigmine (EXELON) 3 MG capsule Take 1 capsule (3 mg total) by mouth 2 (two) times daily. 180 capsule 3  . rOPINIRole (REQUIP) 1 MG tablet Take 2 tablets by mouth daily.      No current facility-administered medications for this visit.     Musculoskeletal: Strength & Muscle Tone: N/A Gait & Station: N/A Patient leans: N/A  Psychiatric Specialty Exam: Review of Systems  Psychiatric/Behavioral: Negative for agitation, behavioral problems, confusion, decreased concentration, dysphoric mood, hallucinations, self-injury, sleep disturbance and suicidal ideas. The patient is not nervous/anxious and is not hyperactive.   All other systems reviewed and are negative.   There were no vitals taken for this visit.There is no height or weight  on file to calculate BMI.  General Appearance: NA  Eye Contact:  NA  Speech:  Clear and Coherent  Volume:  Normal  Mood:  good  Affect:  NA  Thought Process:  Coherent  Orientation:  Full (Time, Place, and Person)  Thought Content: Logical   Suicidal Thoughts:  No  Homicidal Thoughts:  No  Memory:  Immediate;   Good  Judgement:  Good  Insight:  Present  Psychomotor Activity:  Normal  Concentration:  Concentration: Good and Attention Span: Good  Recall:  Poor  Fund of Knowledge: Good  Language: Good  Akathisia:  No  Handed:  Right  AIMS (if indicated): not done  Assets:  Communication Skills Desire for Improvement  ADL's:  Intact  Cognition: WNL  Sleep:  Good   Screenings: MOCA 22/30 on 06/23/2016 (-1 for attention, -1 for language, -5 or delayed recall, -1 for orientation "April 8th") MOCA17/30 on07/30/19(-2 for visuospatial, -2 for  attention, -2 for language, -1 for abstraction, -5 for delayed recall, -1 for orientation (July 23rd) ) Delayed recall 0/3, unable to tell the year, month, day, date06/22/21    Assessment and Plan:  Ariel Archer is a 80 y.o. year old female with a history of  mild neurocognitive disorder, delusion,hypertension,restless leg,lumbar degenerative discdisease, who presents for follow up appointment for below.   1. Mild neurocognitive disorder 2. 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  There has been no known significant paranoia/any concerns about her cognition since the last visit.  We will continue current dose of rivastigmine for neurocognitive disorder while monitoring bradycardia.  We will continue Abilify to target paranoia.  Noted that she had worsening in irritability when we tried to taper off this medication.  Discussed risk of increase in mortality, EPS, and metabolic side effects.  Both the patient and her husband agrees to continue the medication.   Plan I have reviewed and updated plans as below 1. Continue Rivastigmine 3 mg twice a day  2.Continue Abilify 2.5 mg daily 3.Next appointment: 6/27 at 3 PM for 30 mins, in person visit **- Folate, TSH, vitamin B 12 was ordered, but they have not checked them yet - Will obtain release of information to allow 2 way conversation    The patient demonstrates the following risk factors for suicide: Chronic risk factors for suicide include: psychiatric disorder of paranoia. Acute risk factorsfor suicide include: unemployment and recent discharge from inpatient psychiatry. Protective factorsfor this patient include: positive social support, coping skills and hope for the future. Considering these factors, the overall suicide risk at this point appears to be low. Patient isappropriate for outpatient follow up.  Ariel Clay, MD 06/17/2020, 1:29 PM

## 2020-06-17 ENCOUNTER — Encounter: Payer: Self-pay | Admitting: Psychiatry

## 2020-06-17 ENCOUNTER — Other Ambulatory Visit: Payer: Self-pay

## 2020-06-17 ENCOUNTER — Telehealth (INDEPENDENT_AMBULATORY_CARE_PROVIDER_SITE_OTHER): Payer: Medicare Other | Admitting: Psychiatry

## 2020-06-17 DIAGNOSIS — F22 Delusional disorders: Secondary | ICD-10-CM

## 2020-06-17 DIAGNOSIS — G3184 Mild cognitive impairment, so stated: Secondary | ICD-10-CM

## 2020-06-17 MED ORDER — ARIPIPRAZOLE 5 MG PO TABS: 2.5000 mg | ORAL_TABLET | Freq: Every day | ORAL | 0 refills | Status: DC

## 2020-06-17 NOTE — Patient Instructions (Signed)
1. Continue Rivastigmine 3 mg twice a day  2.Continue Abilify 2.5 mg daily 3.Next appointment: 6/27 at 3 PM, in person visit  The next visit will be in person visit. Please arrive 15 mins before the scheduled time.   [facility]  Address: [address], [place], [place] [address]

## 2020-09-03 NOTE — Progress Notes (Signed)
BH MD/PA/NP OP Progress Note  09/08/2020 3:40 PM Ariel Archer  MRN:  [id number]  Chief Complaint:  Chief Complaint   Follow-up    HPI:  This is a follow-up appointment for neurocognitive disorder.   64 of the story was provided by her husband, who presents to the interview (she mostly answered "I don't know" or "I can't hear well.")  She states that she has been doing fine.  She takes a walk for a mile per day, and plays cards.  When she was asked about her son, [name] corrects that he is in [place] [name] states that her son is in [place]).  She met her last year (they met a few months ago).  She is doing well, and she denies any concern.  She does not think she has memory issues.  She sleeps well.  She has good appetite.  She denies any concerns about her current weight.  She denies SI.  She denies AH, VH.  She denies paranoia.   [name] states that they have been "surviving."  They have daily routine.  They are hoping to do ball dance again; will try the place tomorrow.  [name] denies any concern at this time.  Although her blood pressure was high today, he checks it every week, and is usually around 125/72. She sees her PCP regularly.   Orientation- Monday. Unable to tell year, season, month.   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 In [place], moved from [place] to [place] 7 years ago where her cousin lives.  Wt Readings from Last 3 Encounters:  09/08/20 150 lb 12.8 oz (68.4 kg)  01/14/20 143 lb 12.8 oz (65.2 kg)  04/08/18 124 lb 12.8 oz (56.6 kg)    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 I have reviewed the history. No updates at this time.      Past Medical History:  Past Medical History:  Diagnosis Date   Essential hypertension 06/12/2013   SBP is a bit high today. 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 Slowly progressive bilateral hearing loss.  People around her are getting more more frustrated.  There is some component of dementia.  No history of ear surgery, trauma or infection. EXAMINATION shows normal external canals and tympanic membranes. 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 No pertinent surgical history.  Family Psychiatric History: Please see initial evaluation for full details. I have reviewed the history. No updates at this time.     Family History: History reviewed. No pertinent family history.  Social History:  Social History   Socioeconomic History   Marital status: Married  Spouse name: Not on file   Number of children: 2   Years of education: 14   Highest education level: High school graduate  Occupational History   Not on file  Tobacco Use   Smoking status: Never   Smokeless tobacco: Never  Vaping Use   Vaping Use: Never used  Substance and Sexual Activity   Alcohol use: No    Comment: 06-09-2016 per pt rarely   Drug use: No   Sexual activity: Not Currently  Other Topics Concern   Not on file  Social History Narrative   Right handed    Lives with husband   Social Determinants of Health   Financial Resource Strain: Not on file  Food Insecurity:  Not on file  Transportation Needs: Not on file  Physical Activity: Not on file  Stress: Not on file  Social Connections: Not on file    Allergies: No Known Allergies  Metabolic Disorder Labs: Lab Results  Component Value Date   HGBA1C 5.4 06/10/2016   MPG 108 06/10/2016   No results found for: PROLACTIN Lab Results  Component Value Date   CHOL 192 06/10/2016   TRIG 106 06/10/2016   HDL 57 06/10/2016   CHOLHDL 3.4 06/10/2016   VLDL 21 06/10/2016   LDLCALC 114 (H) 06/10/2016   Lab Results  Component Value Date   TSH 1.00 06/10/2016    Therapeutic Level Labs: No results found for: LITHIUM No results found for: VALPROATE No components found for:  CBMZ  Current Medications: Current Outpatient Medications  Medication Sig Dispense Refill   [START ON 09/15/2020] ARIPiprazole (ABILIFY) 2 MG tablet Take 1 tablet (2 mg total) by mouth daily. 90 tablet 1   CRANBERRY PO Take 650 mg by mouth daily.     L-METHYLFOLATE CALCIUM PO Take 1 tablet by mouth daily.     lisinopril (PRINIVIL,ZESTRIL) 40 MG tablet Take 40 mg by mouth daily.  3   Multi Vit-Fluoride-Folic Acid (MULTIVITAMIN/FLUORIDE PO) Take 0.25 mg by mouth daily.     rivastigmine (EXELON) 3 MG capsule Take 1 capsule (3 mg total) by mouth 2 (two) times daily. 180 capsule 3   rOPINIRole (REQUIP) 1 MG tablet Take 2 tablets by mouth daily.      No current facility-administered medications for this visit.     Musculoskeletal: Strength & Muscle Tone:  N/A Gait & Station:  N/A Patient leans: N/A  Psychiatric Specialty Exam: Review of Systems  Psychiatric/Behavioral: Negative.    All other systems reviewed and are negative.  Blood pressure (!) 165/70, pulse 71, temperature 97.8 F (36.6 C), temperature source Temporal, weight 150 lb 12.8 oz (68.4 kg).Body mass index is 25.09 kg/m.  General Appearance: Fairly Groomed  Eye Contact:  Fair  Speech:  Clear and Coherent  Volume:  Normal  Mood:   good  Affect:  Appropriate,  Congruent, and euthymic  Thought Process:  Coherent  Orientation:  Full (Time, Place, and Person) except time (only able to tell Monday)  Thought Content: Logical   Suicidal Thoughts:  No  Homicidal Thoughts:  No  Memory:  Immediate;   Poor  Judgement:  Fair  Insight:  Shallow  Psychomotor Activity:  Normal cogwheel rigidity on bilateral arms, n o tremors  Concentration:  Concentration: Fair and Attention Span: Fair  Recall:  Poor  Fund of Knowledge: Good  Language: Good  Akathisia:  No  Handed:  Right  AIMS (if indicated): not done  Assets:  Communication Skills Desire for Improvement  ADL's:  Intact  Cognition: Impaired,  Mild  Sleep:  Good   Screenings: PHQ2-9    Flowsheet Row Office Visit from 09/08/2020 in [facility]  PHQ-2 Total Score 0       MOCA 22/30 on 06/23/2016 (-1 for attention, -1 for language, -5 or delayed recall, -1 for orientation "April 8th") [facility] 17/30 on 10/11/17 (-2 for visuospatial, -2 for attention, -2 for language, -1 for abstraction, -5 for delayed recall, -1 for orientation (July 23rd) ) Delayed recall 0/3, unable to tell the year, month, day, date 09/04/19   Neuropsychological testing in Nov 2021:  Briefly, results suggested prominent impairments with learning and memory, as well as semantic fluency, confrontation naming, and visuospatial abilities.  Assessment and Plan:  Ariel Archer is a 80 y.o. year old female with a history of mild neurocognitive disorder, delusion, hypertension, restless leg,  lumbar degenerative disc disease, who presents for follow up appointment for below.   1. Mild neurocognitive disorder 2. Paranoia [facility]) Exam is notable for impairment in delayed recall, and her husband provides majority of the story.  She was evaluated by neuropsychologist in 2021; differential includes Alzheimer's disease, although her clinical course is more consistent with Lewy body dementia given fluctuation in her  symptoms/history of psychotic symptoms. There has been no known significant paranoia since the last visit.  We will continue current dose of rivastigmine for neurocognitive disorder.  We will lower Abilify to target paranoia given cogwheel rigidity on her bilateral arms.  Discussed potential risk of increasing mortality, metabolic side effect and EPS.  The patient and her husband were advised to contact the clinic if any worsening in her symptoms.   Plan I have reviewed and updated plans as below 1. Continue Rivastigmine 3 mg twice a day  2. Decrease Abilify 2 mg daily  3. Next appointment:  10/4 at 3 PM for 30 mins, video **- Folate, TSH, vitamin B 12 was ordered, but they have not checked them yet - Will obtain release of information to allow 2 way conversation       The patient demonstrates the following risk factors for suicide: Chronic risk factors for suicide include: psychiatric disorder of paranoia. Acute risk factors for suicide include: unemployment and recent discharge from inpatient psychiatry. Protective factors for this patient include: positive social support, coping skills and hope for the future. Considering these factors, the overall suicide risk at this point appears to be low. Patient is appropriate for outpatient follow up.        Norman Clay, MD 09/08/2020, 3:40 PM

## 2020-09-08 ENCOUNTER — Other Ambulatory Visit: Payer: Self-pay

## 2020-09-08 ENCOUNTER — Ambulatory Visit (INDEPENDENT_AMBULATORY_CARE_PROVIDER_SITE_OTHER): Payer: Medicare Other | Admitting: Psychiatry

## 2020-09-08 ENCOUNTER — Encounter: Payer: Self-pay | Admitting: Psychiatry

## 2020-09-08 VITALS — BP 165/70 | HR 71 | Temp 97.8°F | Wt 150.8 lb

## 2020-09-08 DIAGNOSIS — G3184 Mild cognitive impairment, so stated: Secondary | ICD-10-CM | POA: Diagnosis not present

## 2020-09-08 DIAGNOSIS — F22 Delusional disorders: Secondary | ICD-10-CM

## 2020-09-08 MED ORDER — ARIPIPRAZOLE 2 MG PO TABS: 2.0000 mg | ORAL_TABLET | Freq: Every day | ORAL | 1 refills | Status: DC

## 2020-09-08 NOTE — Patient Instructions (Signed)
1. Continue Rivastigmine 3 mg twice a day  2. Decrease Abilify 2 mg daily  3. Next appointment:  10/4 at 3 PM

## 2020-09-19 ENCOUNTER — Telehealth (INDEPENDENT_AMBULATORY_CARE_PROVIDER_SITE_OTHER): Payer: Medicare Other | Admitting: Neurology

## 2020-09-19 ENCOUNTER — Encounter: Payer: Self-pay | Admitting: Neurology

## 2020-09-19 ENCOUNTER — Other Ambulatory Visit: Payer: Self-pay

## 2020-09-19 DIAGNOSIS — G309 Alzheimer's disease, unspecified: Secondary | ICD-10-CM | POA: Diagnosis not present

## 2020-09-19 DIAGNOSIS — F0280 Dementia in other diseases classified elsewhere without behavioral disturbance: Secondary | ICD-10-CM

## 2020-09-19 MED ORDER — RIVASTIGMINE TARTRATE 3 MG PO CAPS
3.0000 mg | ORAL_CAPSULE | Freq: Two times a day (BID) | ORAL | 3 refills | Status: DC
Start: 2020-09-19 — End: 2021-04-15

## 2020-09-19 NOTE — Progress Notes (Signed)
Telephone (Audio) Visit The purpose of this telephone visit is to provide medical care while limiting exposure to the novel coronavirus.    Consent was obtained for telephone visit:  Yes.   Answered questions that patient had about telehealth interaction:  Yes.   I discussed the limitations, risks, security and privacy concerns of performing an evaluation and management service by telephone. I also discussed with the patient that there may be a patient responsible charge related to this service. The patient expressed understanding and agreed to proceed.  Pt location: Home Physician Location: office Name of referring provider:  Harold Barban, MD I connected with .Dhani Knipple at patients initiation/request on 09/19/2020 at  4:00 PM EDT by telephone and verified that I am speaking with the correct person using two identifiers.  Pt MRN:  [id number] Pt DOB:  10-12-1940   History of Present Illness:  The patient had a telephone visit on 09/19/2020. She was last seen 8 months ago for dementia. Her husband Ariel Archer is present to provide the history. She has minimal insight into her condition and states her memory is fine. Ariel Archer manages medications, finances, and meals. She does not drive. Ariel Archer reports memory is the same, some days are better than others. She is on Rivastigmine 3mg  BID without side effects. She is independent with dressing and bathing. She denies any headaches, dizziness, vision changes, focal numbness/tingling/weakness, no falls. Sleep is good. denies any recurrence of paranoia with recent reduction in Abilify dose. No hallucinations. She walks a mile daily on the treadmill.    History on Initial Assessment 01/14/2020: This is an 80 year old right-handed woman with a history of hypertension, RLS, depression, presenting for evaluation of dementia. She is accompanied by her husband who helps supplement the history today. Records from her recent Neuropsychological evaluation done  12/2019 were reviewed. Her husband states symptoms started around 2018 when she started having paranoia about a neighbor. She was evaluated by Dr. 2019 with Psychiatry with a MOCA score of 22/30. She had an MRI brain without contrast in 2018 which I personally reviewed, no acute changes, there is mild diffuse atrophy. She states "I don't forget things." Her husband started noticing changes a year ago, she was forgetful, she would not remember events that happened such as their trips. Once she started Abilify a year ago, the paranoia resolved. She states she does not take any medications. Her husband manages medications and finances. She says she drives and denies getting lost, however her husband reports that he does the driving. She denies misplacing things, he reports that she is organized. She is independent with dressing and bathing. Her husband denies any further paranoia, no hallucinations. Sleep is good, no REM behavior disorder. She states her mood is good. She likes doing 1000-piece puzzles, playing cards, walking daily. There is no family history of dementia, no history of significant concussions. She very seldom drinks alcohol. She denies any headaches, dizziness, diplopia, dysarthria/dysphagia, neck/back pain, focal numbness/tingling/weakness, anosmia. She is "a little bit shaky."   Neuropsychological evaluation in October 2021 indicated prominent impairments with learning and memory, as well as semantic fluency, confrontation naming, and visuospatial abilities. She met criteria for Major Neurocognitive Disorder, etiology concerning for Alzheimer's disease. Due to initial presentation of paranoia, concern for Lewy body dementia had been raised, however she did not display classic features and this was felt to be very unlikely.     Current Outpatient Medications on File Prior to Visit  Medication Sig Dispense  Refill   ARIPiprazole (ABILIFY) 2 MG tablet Take 1 tablet (2 mg total) by mouth daily.  90 tablet 1   CRANBERRY PO Take 650 mg by mouth daily.     L-METHYLFOLATE CALCIUM PO Take 1 tablet by mouth daily.     lisinopril (PRINIVIL,ZESTRIL) 40 MG tablet Take 40 mg by mouth daily.  3   Multi Vit-Fluoride-Folic Acid (MULTIVITAMIN/FLUORIDE PO) Take 0.25 mg by mouth daily.     rivastigmine (EXELON) 3 MG capsule Take 1 capsule (3 mg total) by mouth 2 (two) times daily. 180 capsule 3   rOPINIRole (REQUIP) 1 MG tablet Take 2 tablets by mouth daily.      No current facility-administered medications on file prior to visit.     Observations/Objective:   Limited due to nature of phone visit. Patient is awake, alert, with no dysarthria noted.   Assessment and Plan:   This is an 80 yo RH woman with a history of hypertension, RLS, depression, with Neuropsychological evaluation in 12/2019 indicating dementia, likely due to Alzheimer's disease. She had paranoia and personality changes in 2018 however cognitive profile on testing was felt more consistent with AD rather than Lewy body dementia.  MRI brain no acute changes, there is mild diffuse atrophy. She has not had any further paranoia since initiation of Abilify, no recurrence with recent dose reduction. Memory overall stable, continue Rivastigmine 3mg  BID. Continue close supervision, she does not drive. Follow-up in 6-8 months, call for any changes.    Follow Up Instructions:   -I discussed the assessment and treatment plan with the patient/husband. The patient/husband were provided an opportunity to ask questions and all were answered. The patient/husband agreed with the plan and demonstrated an understanding of the instructions.   The patient/husband were advised to call back or seek an in-person evaluation if the symptoms worsen or if the condition fails to improve as anticipated.    Total Time spent in visit with the patient was:  13 minutes, of which 100% of the time was spent in counseling and/or coordinating care as noted above.    Pt's husband understands and agrees with the plan of care outlined.     Cameron Sprang, MD

## 2020-09-19 NOTE — Patient Instructions (Signed)
Continue Rivastigmine 3mg  twice a day  2. Continue follow-up with Dr.  3. Follow-up in 6-8 months, call for any changes   FALL PRECAUTIONS: Be cautious when walking. Scan the area for obstacles that may increase the risk of trips and falls. When getting up in the mornings, sit up at the edge of the bed for a few minutes before getting out of bed. Consider elevating the bed at the head end to avoid drop of blood pressure when getting up. Walk always in a well-lit room (use night lights in the walls). Avoid area rugs or power cords from appliances in the middle of the walkways. Use a walker or a cane if necessary and consider physical therapy for balance exercise. Get your eyesight checked regularly.   HOME SAFETY: Consider the safety of the kitchen when operating appliances like stoves, microwave oven, and blender. Consider having supervision and share cooking responsibilities until no longer able to participate in those. Accidents with firearms and other hazards in the house should be identified and addressed as well.   ABILITY TO BE LEFT ALONE: If patient is unable to contact 911 operator, consider using LifeLine, or when the need is there, arrange for someone to stay with patients. Smoking is a fire hazard, consider supervision or cessation. Risk of wandering should be assessed by caregiver and if detected at any point, supervision and safe proof recommendations should be instituted.   RECOMMENDATIONS FOR ALL PATIENTS WITH MEMORY PROBLEMS: 1. Continue to exercise (Recommend 30 minutes of walking everyday, or 3 hours every week) 2. Increase social interactions - continue going to [place] and enjoy social gatherings with friends and family 3. Eat healthy, avoid fried foods and eat more fruits and vegetables 4. Maintain adequate blood pressure, blood sugar, and blood cholesterol level. Reducing the risk of stroke and cardiovascular disease also helps promoting better memory. 5. Avoid  stressful situations. Live a simple life and avoid aggravations. Organize your time and prepare for the next day in anticipation. 6. Sleep well, avoid any interruptions of sleep and avoid any distractions in the bedroom that may interfere with adequate sleep quality 7. Avoid sugar, avoid sweets as there is a strong link between excessive sugar intake, diabetes, and cognitive impairment We discussed the Mediterranean diet, which has been shown to help patients reduce the risk of progressive memory disorders and reduces cardiovascular risk. This includes eating fish, eat fruits and green leafy vegetables, nuts like almonds and hazelnuts, walnuts, and also use olive oil. Avoid fast foods and fried foods as much as possible. Avoid sweets and sugar as sugar use has been linked to worsening of memory function.  There is always a concern of gradual progression of memory problems. If this is the case, then we may need to adjust level of care according to patient needs. Support, both to the patient and caregiver, should then be put into place.

## 2020-12-14 NOTE — Progress Notes (Signed)
Virtual Visit via Telephone Note  I connected with Ariel Archer on 12/16/20 at  3:00 PM EDT by telephone and verified that I am speaking with the correct person using two identifiers.  Location: Patient: home Provider: office Persons participated in the visit- patient, provider    I discussed the limitations, risks, security and privacy concerns of performing an evaluation and management service by telephone and the availability of in person appointments. I also discussed with the patient that there may be a patient responsible charge related to this service. The patient expressed understanding and agreed to proceed.   I discussed the assessment and treatment plan with the patient. The patient was provided an opportunity to ask questions and all were answered. The patient agreed with the plan and demonstrated an understanding of the instructions.   The patient was advised to call back or seek an in-person evaluation if the symptoms worsen or if the condition fails to improve as anticipated.  I provided 12 minutes of non-face-to-face time during this encounter.   Neysa Hotter, MD    [facility] MD/PA/NP OP Progress Note  12/16/2020 3:29 PM [name]  MRN:  [id number]  Chief Complaint:  Chief Complaint   Follow-up; Other    HPI:  This is a follow-up appointment for neurocognitive disorder and paranoia.   [name], her husband helps to elaborate majority of the story.  There has been no change since the last visit.  She saw her family member, including her son in [place].  She does not know where her daughter lives.  She spends time doing puzzles and going outside.  She believes that everything is okay, and reports her memory is fine.  She states that she has not cooked as much as her husband wants to do so.  Although she initially reports that she feels confused at times when she cooks, she later denies this, stating that she has been cooking for a long time.  She denies paranoia,  hallucinations.  She denies feeling depressed or anxiety.  She sleeps well and has good appetite.   [name] has not noticed any difference since lowering Abilify. Both of them feel comfortable to stay on the current medication.    Wt Readings from Last 3 Encounters:  09/08/20 150 lb 12.8 oz (68.4 kg)  01/14/20 143 lb 12.8 oz (65.2 kg)  04/08/18 124 lb 12.8 oz (56.6 kg)     Functional Status Instrumental Activities of Daily Living (IADLs):  [name] is independent in the following: cooking Requires assistance with the following: managing finances, medications [name] will put medication in pillbox)  Activities of Daily Living (ADLs):  [name] is independent in the following: bathing and hygiene, feeding, continence, grooming and toileting, walking  Employment: retired, used to work at [employer] for seven years at age 89, used to work at [employer]  Support: husband Household: husband Marital status: married Number of children: 2 Born and grew up in [place], moved to [place]. In [place], moved from [place] to [place] 7 years ago where her cousin lives.  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 Paranoia (HCC)  F22       Past Psychiatric History: Please see initial evaluation for full details. I have reviewed the history. No updates at this time.     Past Medical History:  Past Medical History:  Diagnosis Date   Essential hypertension 06/12/2013   SBP is a bit high  today. 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 Slowly progressive bilateral hearing loss.  People around her are getting more more frustrated.  There is some component of dementia.  No history of ear surgery, trauma or infection. EXAMINATION shows normal external canals and tympanic membranes. 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  Family Psychiatric History: Please see initial evaluation for full details. I have reviewed the history. No updates at this time.     Family History: No family history on file.  Social History:  Social History   Socioeconomic History   Marital status: Married    Spouse name: Not on file   Number of children: 2   Years of education: 12   Highest education level: High school graduate  Occupational History   Not on file  Tobacco Use   Smoking status: Never   Smokeless tobacco: Never  Vaping Use   Vaping Use: Never used  Substance and Sexual Activity   Alcohol use: No    Comment: 06-09-2016 per pt rarely   Drug use: No   Sexual activity: Not Currently  Other Topics Concern   Not on file  Social History Narrative   Right handed    Lives with husband   Social Determinants of Health   Financial Resource Strain: Not on file  Food Insecurity: Not on file  Transportation Needs: Not on file  Physical Activity: Not on file  Stress: Not on file  Social Connections: Not on file    Allergies: No Known Allergies  Metabolic Disorder Labs: Lab Results  Component Value Date   HGBA1C 5.4 06/10/2016   MPG 108 06/10/2016   No results found for: PROLACTIN Lab Results  Component Value Date   CHOL 192 06/10/2016   TRIG 106 06/10/2016   HDL 57 06/10/2016   CHOLHDL 3.4 06/10/2016   VLDL 21 06/10/2016   LDLCALC 114 (H) 06/10/2016   Lab Results  Component Value Date   TSH 1.00 06/10/2016    Therapeutic Level Labs: No results found for: LITHIUM No results  found for: VALPROATE No components found for:  CBMZ  Current Medications: Current Outpatient Medications  Medication Sig Dispense Refill   [START ON 03/14/2021] ARIPiprazole (ABILIFY) 2 MG tablet Take 1 tablet (2 mg total) by mouth daily. 90 tablet 0   CRANBERRY PO Take 650 mg by mouth daily.     L-METHYLFOLATE CALCIUM PO Take 1 tablet by mouth daily.     lisinopril (PRINIVIL,ZESTRIL) 40 MG tablet Take 40 mg by mouth daily.  3   Multi Vit-Fluoride-Folic Acid (MULTIVITAMIN/FLUORIDE PO) Take 0.25 mg by mouth daily.     rivastigmine (EXELON) 3 MG capsule Take 1 capsule (3 mg total) by mouth 2 (two) times daily. 180 capsule 3   rOPINIRole (REQUIP) 1 MG tablet Take 2 tablets by mouth daily.      No current facility-administered medications for this visit.     Musculoskeletal: Strength & Muscle Tone:  N/A Gait &  Station:  N/A Patient leans: N/A  Psychiatric Specialty Exam: Review of Systems  Psychiatric/Behavioral: Negative.    All other systems reviewed and are negative.  There were no vitals taken for this visit.There is no height or weight on file to calculate BMI.  General Appearance: NA  Eye Contact:  NA  Speech:  Clear and Coherent  Volume:  Normal  Mood:   good  Affect:  NA  Thought Process:  Coherent  Orientation:  Full (Time, Place, and Person)  except time   Thought Content: Logical   Suicidal Thoughts:  No  Homicidal Thoughts:  No  Memory:  Immediate;   Fair  Judgement:  Good  Insight:  Shallow  Psychomotor Activity:  Normal  Concentration:  Concentration: Good and Attention Span: Good  Recall:  Poor  Fund of Knowledge: Good  Language: Good  Akathisia:  No  Handed:  Right  AIMS (if indicated): not done  Assets:  Communication Skills Desire for Improvement  ADL's:  Intact  Cognition: WNL  Sleep:  Good   Screenings: PHQ2-9    Flowsheet Row Video Visit from 12/16/2020 in [facility] Office Visit from 09/08/2020 in [facility]  PHQ-2 Total Score 0 0      Flowsheet Row Video Visit from 12/16/2020 in [facility]  C-SSRS RISK CATEGORY No Risk      MOCA 22/30 on 06/23/2016 (-1 for attention, -1 for language, -5 or delayed recall, -1 for orientation "April 8th") [facility] 17/30 on 10/11/17 (-2 for visuospatial, -2 for attention, -2 for language, -1 for abstraction, -5 for delayed recall, -1 for orientation (July 23rd) ) Delayed recall 0/3, unable to tell the year, month, day, date 09/04/19   Neuropsychological testing in Nov 2021:  Briefly, results suggested prominent impairments with learning and memory, as well as semantic fluency, confrontation naming, and visuospatial abilities.  Assessment and Plan:  Juliauna Stueve is a 80 y.o. year old female with a history of mild neurocognitive disorder, delusion, hypertension, restless leg,  lumbar degenerative disc disease, who presents for follow up appointment for below.    1. Mild neurocognitive disorder 2. Paranoia (HCC) Although she continues to have impairment in delayed recall, there has been no paranoia or behavioral issues since tapering down Abilify. She was evaluated by neuropsychologist in 2021; differential includes Alzheimer's disease, although her clinical course is more consistent with Lewy body dementia given fluctuation in her symptoms/history of psychotic symptoms.  Will continue current dose of Abilify to target paranoia.  Noted that she did have cogwheel rigidity on her bilateral arms when she was on the higher dose of the medication.  Will continue rivastigmine for neurocognitive disorder.   This clinician has discussed the side effect associated with medication prescribed during this encounter. Please refer to notes in the   Plan I have reviewed and updated plans as below  1. Continue Rivastigmine 3 mg twice a day  2. Continue Abilify 2 mg daily  3. Next appointment:  3/30 at 1PM for 20 mins,  video **- Folate, TSH, vitamin B 12 was ordered, but they have not checked them yet - Will obtain release of information to allow 2 way conversation       The patient demonstrates the following risk factors for suicide: Chronic risk factors for suicide include: psychiatric disorder of paranoia. Acute risk factors for suicide include: unemployment and recent discharge from inpatient psychiatry. Protective factors for this patient include: positive social support, coping skills and  hope for the future. Considering these factors, the overall suicide risk at this point appears to be low. Patient is appropriate for outpatient follow up.        Neysa Hotter, MD 12/16/2020, 3:29 PM

## 2020-12-16 ENCOUNTER — Encounter: Payer: Self-pay | Admitting: Psychiatry

## 2020-12-16 ENCOUNTER — Telehealth (INDEPENDENT_AMBULATORY_CARE_PROVIDER_SITE_OTHER): Payer: Medicare Other | Admitting: Psychiatry

## 2020-12-16 ENCOUNTER — Other Ambulatory Visit: Payer: Self-pay

## 2020-12-16 DIAGNOSIS — G3184 Mild cognitive impairment, so stated: Secondary | ICD-10-CM

## 2020-12-16 DIAGNOSIS — F22 Delusional disorders: Secondary | ICD-10-CM | POA: Diagnosis not present

## 2020-12-16 MED ORDER — ARIPIPRAZOLE 2 MG PO TABS: 2.0000 mg | ORAL_TABLET | Freq: Every day | ORAL | 0 refills | Status: DC

## 2021-04-15 ENCOUNTER — Ambulatory Visit (INDEPENDENT_AMBULATORY_CARE_PROVIDER_SITE_OTHER): Payer: Medicare Other | Admitting: Neurology

## 2021-04-15 ENCOUNTER — Encounter: Payer: Self-pay | Admitting: Neurology

## 2021-04-15 ENCOUNTER — Other Ambulatory Visit: Payer: Self-pay

## 2021-04-15 VITALS — BP 153/71 | HR 66 | Ht 66.0 in | Wt 154.6 lb

## 2021-04-15 DIAGNOSIS — F0280 Dementia in other diseases classified elsewhere without behavioral disturbance: Secondary | ICD-10-CM | POA: Diagnosis not present

## 2021-04-15 DIAGNOSIS — G309 Alzheimer's disease, unspecified: Secondary | ICD-10-CM | POA: Diagnosis not present

## 2021-04-15 MED ORDER — RIVASTIGMINE TARTRATE 3 MG PO CAPS: 3.0000 mg | ORAL_CAPSULE | Freq: Two times a day (BID) | ORAL | 3 refills | Status: DC

## 2021-04-15 NOTE — Patient Instructions (Signed)
Good to see you. Continue Rivastigmine 3mg  twice a day. Continue follow-up with Dr. . Look into [facility] day program. Follow-up in 6 months, call for any changes  FALL PRECAUTIONS: Be cautious when walking. Scan the area for obstacles that may increase the risk of trips and falls. When getting up in the mornings, sit up at the edge of the bed for a few minutes before getting out of bed. Consider elevating the bed at the head end to avoid drop of blood pressure when getting up. Walk always in a well-lit room (use night lights in the walls). Avoid area rugs or power cords from appliances in the middle of the walkways. Use a walker or a cane if necessary and consider physical therapy for balance exercise. Get your eyesight checked regularly.  HOME SAFETY: Consider the safety of the kitchen when operating appliances like stoves, microwave oven, and blender. Consider having supervision and share cooking responsibilities until no longer able to participate in those. Accidents with firearms and other hazards in the house should be identified and addressed as well.  ABILITY TO BE LEFT ALONE: If patient is unable to contact 911 operator, consider using LifeLine, or when the need is there, arrange for someone to stay with patients. Smoking is a fire hazard, consider supervision or cessation. Risk of wandering should be assessed by caregiver and if detected at any point, supervision and safe proof recommendations should be instituted.   RECOMMENDATIONS FOR ALL PATIENTS WITH MEMORY PROBLEMS: 1. Continue to exercise (Recommend 30 minutes of walking everyday, or 3 hours every week) 2. Increase social interactions - continue going to [place] and enjoy social gatherings with friends and family 3. Eat healthy, avoid fried foods and eat more fruits and vegetables 4. Maintain adequate blood pressure, blood sugar, and blood cholesterol level. Reducing the risk of stroke and cardiovascular disease also helps  promoting better memory. 5. Avoid stressful situations. Live a simple life and avoid aggravations. Organize your time and prepare for the next day in anticipation. 6. Sleep well, avoid any interruptions of sleep and avoid any distractions in the bedroom that may interfere with adequate sleep quality 7. Avoid sugar, avoid sweets as there is a strong link between excessive sugar intake, diabetes, and cognitive impairment The Mediterranean diet has been shown to help patients reduce the risk of progressive memory disorders and reduces cardiovascular risk. This includes eating fish, eat fruits and green leafy vegetables, nuts like almonds and hazelnuts, walnuts, and also use olive oil. Avoid fast foods and fried foods as much as possible. Avoid sweets and sugar as sugar use has been linked to worsening of memory function.  There is always a concern of gradual progression of memory problems. If this is the case, then we may need to adjust level of care according to patient needs. Support, both to the patient and caregiver, should then be put into place.

## 2021-04-15 NOTE — Progress Notes (Signed)
NEUROLOGY FOLLOW UP OFFICE NOTE  Ariel Archer [id number] April 10, 1940  HISTORY OF PRESENT ILLNESS: I had the pleasure of seeing Ariel Archer in follow-up in the neurology clinic on 04/15/2021.  The patient was last evaluated 7 months ago for dementia. She is again accompanied by her husband [name] who helps supplement the history today.  Records and images were personally reviewed where available.  Since her last visit, she feels her memory is good. Her husband notes everything is the same except for her short-term memory, symptoms overall stable on Rivastigmine 82m BID, no side effects. SRichardson Landrymanages medications, finances, and meals. She does not drive. She is able to bathe and dress independently. No personality changes, paranoia, or hallucinations, sleep is good. They keep active and walk a mile daily.    History on Initial Assessment 01/14/2020: This is an 81year old right-handed woman with a history of hypertension, RLS, depression, presenting for evaluation of dementia. She is accompanied by her husband who helps supplement the history today. Records from her recent Neuropsychological evaluation done 12/2019 were reviewed. Her husband states symptoms started around 2018 when she started having paranoia about a neighbor. She was evaluated by Dr. HModesta Messingwith Psychiatry with a MOCA score of 22/30. She had an MRI brain without contrast in 2018 which I personally reviewed, no acute changes, there is mild diffuse atrophy. She states "I don't forget things." Her husband started noticing changes a year ago, she was forgetful, she would not remember events that happened such as their trips. Once she started Abilify a year ago, the paranoia resolved. She states she does not take any medications. Her husband manages medications and finances. She says she drives and denies getting lost, however her husband reports that he does the driving. She denies misplacing things, he reports that she is organized. She is  independent with dressing and bathing. Her husband denies any further paranoia, no hallucinations. Sleep is good, no REM behavior disorder. She states her mood is good. She likes doing 1000-piece puzzles, playing cards, walking daily. There is no family history of dementia, no history of significant concussions. She very seldom drinks alcohol. She denies any headaches, dizziness, diplopia, dysarthria/dysphagia, neck/back pain, focal numbness/tingling/weakness, anosmia. She is "a little bit shaky."   Neuropsychological evaluation in October 2021 indicated prominent impairments with learning and memory, as well as semantic fluency, confrontation naming, and visuospatial abilities. She met criteria for Major Neurocognitive Disorder, etiology concerning for Alzheimer's disease. Due to initial presentation of paranoia, concern for Lewy body dementia had been raised, however she did not display classic features and this was felt to be very unlikely.    PAST MEDICAL HISTORY: Past Medical History:  Diagnosis Date   Essential hypertension 06/12/2013   SBP is a bit high today. 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 Slowly progressive bilateral hearing loss.  People around her are getting more more frustrated.  There is some component of dementia.  No history of ear surgery, trauma or infection. EXAMINATION shows normal external canals and tympanic membranes. AUDIOGRAM Shows moderate to severe sens  Spondylosis of lumbar region without myelopathy or radiculopathy  04/03/2016    MEDICATIONS: Current Outpatient Medications on File Prior to Visit  Medication Sig Dispense Refill   ARIPiprazole (ABILIFY) 2 MG tablet Take 1 tablet (2 mg total) by mouth daily. 90 tablet 0   CRANBERRY PO Take 650 mg by mouth daily.     L-METHYLFOLATE CALCIUM PO Take 1 tablet by mouth daily.     lisinopril (PRINIVIL,ZESTRIL) 40 MG tablet Take 40 mg by mouth daily.  3   Multi Vit-Fluoride-Folic Acid (MULTIVITAMIN/FLUORIDE PO) Take 0.25 mg by mouth daily.     rivastigmine (EXELON) 3 MG capsule Take 1 capsule (3 mg total) by mouth 2 (two) times daily. 180 capsule 3   rOPINIRole (REQUIP) 1 MG tablet Take 2 tablets by mouth daily.      No current facility-administered medications on file prior to visit.    ALLERGIES: No Known Allergies  FAMILY HISTORY: History reviewed. No pertinent family history.  SOCIAL HISTORY: Social History   Socioeconomic History   Marital status: Married    Spouse name: Not on file   Number of children: 2   Years of education: 12   Highest education level: High school graduate  Occupational History   Not on file  Tobacco Use   Smoking status: Never   Smokeless tobacco: Never  Vaping Use   Vaping Use: Never used  Substance and Sexual Activity   Alcohol use: No    Comment: 06-09-2016 per pt rarely   Drug use: No   Sexual activity: Not Currently  Other Topics Concern   Not on file  Social History Narrative   Right handed    Lives with husband   Social Determinants of Health   Financial Resource Strain: Not on file  Food Insecurity: Not on file  Transportation Needs: Not on file  Physical Activity: Not on file  Stress: Not on file  Social Connections: Not on file  Intimate Partner Violence: Not on file     PHYSICAL EXAM: Vitals:   04/15/21 1521  BP: (!) 153/71  Pulse: 66  SpO2: 99%   General: No acute distress Head:  Normocephalic/atraumatic Skin/Extremities: No rash, no edema Neurological Exam: alert and oriented  to person, place. No aphasia or dysarthria. Fund of knowledge is appropriate.  Recent and remote memory are impaired, 0/3 delayed recall. Attention and concentration are normal. She is able to name with her husband translating some words in [place].  Cranial nerves: Pupils equal, round. Extraocular movements intact with no nystagmus. Visual fields full.  No facial asymmetry.  HOH using hearing aids. Motor: Bulk and tone normal, muscle strength 5/5 throughout with no pronator drift.   Finger to nose testing intact.  Gait narrow-based and steady, able to tandem walk adequately.  Romberg negative.   IMPRESSION: This is an 81 yo RH woman with a history of hypertension, RLS, depression, with Neuropsychological evaluation in 12/2019 indicating dementia, likely due to Alzheimer's disease. She had paranoia and personality changes in 2018 however cognitive profile on testing was felt more consistent with AD rather than Lewy body dementia. No further paranoia per husband. They report symptoms overall stable, continue Rivastigmine 56m BID. Continue follow-up with Psychiatry. Continue close supervision, she discussed the importance of control of vascular risk factors, physical exercise, brain stimulation exercises for brain health. She was encouraged to join day program at [facility] Follow-up with Memory Disorders PA SSharene Buttersin 6 months, call for any changes.   Thank you for allowing me  to participate in her care.  Please do not hesitate to call for any questions or concerns.    Ellouise Newer, M.D.   CC: Gracy Bruins, FNP

## 2021-06-11 ENCOUNTER — Telehealth: Payer: Medicare Other | Admitting: Psychiatry

## 2021-06-28 ENCOUNTER — Other Ambulatory Visit: Payer: Self-pay | Admitting: Psychiatry

## 2021-07-14 NOTE — Progress Notes (Signed)
Virtual Visit via Video Note ? ?I connected with Ariel Archer on 07/16/21 at  8:20 AM EDT by a video enabled telemedicine application and verified that I am speaking with the correct person using two identifiers. ? ?Location: ?Patient: home ?Provider: office ?Persons participated in the visit- patient, provider, husband/Steve ?  ?I discussed the limitations of evaluation and management by telemedicine and the availability of in person appointments. The patient expressed understanding and agreed to proceed. ? ?  ?I discussed the assessment and treatment plan with the patient. The patient was provided an opportunity to ask questions and all were answered. The patient agreed with the plan and demonstrated an understanding of the instructions. ?  ?The patient was advised to call back or seek an in-person evaluation if the symptoms worsen or if the condition fails to improve as anticipated. ? ?I provided 13 minutes of non-face-to-face time during this encounter. ? ? ?Norman Clay, MD ? ? ? ?BH MD/PA/NP OP Progress Note ? ?07/16/2021 9:14 AM ?Ariel Archer  ?MRN:  [id number] ? ?Chief Complaint:  ?Chief Complaint  ?Patient presents with  ? Follow-up  ? Other  ? ?HPI:  ?This is a follow-up appointment for neurocognitive disorder and paranoia.  ?[name], her husband reports majority of the history.  ?He states that everything is stable. She does not cook as much compared to before.  He denies any episodes of paranoia or hallucinations.  She takes medication regularly. He denies any safety concern, and feels comfortable to stay on the current medication regimen. No known falls.  ? ?Cherell states that she is "not sick" when she was offered to do this visit.  She states that she is doing "fine."  She met with her children the other time. She "don't know" where they live. She spends time doing puzzle, playing cards. She does treadmil at times (this was reminded by [name]).  She states that nothing is wrong with her.  She sleeps well.   She has good appetite.  She denies paranoia.  She denies hallucinations.  She denies SI, HI.  She does not think she has any memory issues.  ? ?Orientation- "don't know (when asked what today is)." She is in "[place], [place]." ? ? ?Vitals:  ?  04/15/21 1521  ?BP: (!) 153/71  ?Pulse: 66  ?SpO2: 99%  ? ? ?Functional Status ?Instrumental Activities of Daily Living (IADLs):  ?Ariel Archer is independent in the following:  ?Requires assistance with the following: managing finances, medications [name] will put medication in pillbox), cooking ? ?Activities of Daily Living (ADLs):  ?Ariel Archer is independent in the following: bathing and hygiene, feeding, continence, grooming and toileting, walking ?  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 In 1966, moved from [place] to [place] 7 years ago where her cousin lives. ? ?Visit Diagnosis:  ?  ICD-10-CM   ?1. 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  ?2. 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  ? ? ?Past Psychiatric History: Please see initial evaluation for full details. I have reviewed the history. No updates at this time.  ?  ? ?Past Medical History:  ?Past Medical History:  ?Diagnosis Date  ? Essential hypertension 06/12/2013  ? SBP is a bit high today. She is taking lisinopril $RemoveBeforeDEI'10mg'bFTylXyYuSqhCrZn$ /d.`E1o3L`IMPRESSION: recheck blood pressure at follow  up  ? 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 Lumbar degenerative disc disease 04/03/2016  ? Lumbar radiculopathy 04/03/2016  ? Major neurocognitive disorder due to Alzheimer's disease 12/20/2019  ? Osteopenia of multiple sites 10/03/2017  ? T scores: -2.3 spine,  -1.9 Fem neck,  2019  ? Restless leg syndrome 08/19/2012  ? Sensorineural hearing loss (SNHL) of both ears 06/12/2013  ? Concern over her hearing. Slowly progressive bilateral hearing loss.  People around  her are getting more more frustrated.  There is some component of dementia.  No history of ear surgery, trauma or infection. EXAMINATION shows normal external canals and tympanic membranes. AUDIOGRAM Shows moderate to severe sens  ? Spondylosis of lumbar region without myelopathy or radiculopathy 04/03/2016  ? No past surgical history on file. ? ?Family Psychiatric History: Please see initial evaluation for full details. I have reviewed the history. No updates at this time.  ?  ? ?Family History: No family history on file. ? ?Social History:  ?Social History  ? ?Socioeconomic History  ? Marital status: Married  ?  Spouse name: Not on file  ? Number of children: 2  ? Years of education: 69  ? Highest education level: High school graduate  ?Occupational History  ? Not on file  ?Tobacco Use  ? Smoking status: Never  ? Smokeless tobacco: Never  ?Vaping Use  ? Vaping Use: Never used  ?Substance and Sexual Activity  ? Alcohol use: No  ?  Comment: 06-09-2016 per pt rarely  ? Drug use: No  ? Sexual activity: Not Currently  ?Other Topics Concern  ? Not on file  ?Social History Narrative  ? Right handed   ? Lives with husband  ? ?Social Determinants of Health  ? ?Financial Resource Strain: Not on file  ?Food Insecurity: Not on file  ?Transportation Needs: Not on file  ?Physical Activity: Not on file  ?Stress: Not on file  ?Social Connections: Not on file  ? ? ?Allergies: No Known Allergies ? ?Metabolic Disorder Labs: ?Lab Results  ?Component Value Date  ? HGBA1C 5.4 06/10/2016  ? MPG 108 06/10/2016  ? ?No results found for: PROLACTIN ?Lab Results  ?Component Value Date  ? CHOL 192 06/10/2016  ? TRIG 106 06/10/2016  ? HDL 57 06/10/2016  ? CHOLHDL 3.4 06/10/2016  ? VLDL 21 06/10/2016  ? LDLCALC 114 (H) 06/10/2016  ? ?Lab Results  ?Component Value Date  ? TSH 1.00 06/10/2016  ? ? ?Therapeutic Level Labs: ?No results found for: LITHIUM ?No results found for: VALPROATE ?No components found for:  CBMZ ? ?Current  Medications: ?Current Outpatient Medications  ?Medication Sig Dispense Refill  ? ARIPiprazole (ABILIFY) 2 MG tablet TAKE ONE TABLET BY MOUTH ONE TIME DAILY 30 tablet 0  ? CRANBERRY PO Take 650 mg by mouth daily.    ? L-METHYLFOLATE CALCIUM PO Take 1 tablet by mouth daily.    ? lisinopril (ZESTRIL) 20 MG tablet Take by mouth daily.  3  ? Multi Vit-Fluoride-Folic Acid (MULTIVITAMIN/FLUORIDE PO) Take 0.25 mg by mouth daily.    ? rivastigmine (EXELON) 3 MG capsule Take 1 capsule (3 mg total) by mouth 2 (two) times daily. 180 capsule 3  ? rOPINIRole (REQUIP) 1 MG tablet Take 2 tablets by mouth daily.     ? ?No current facility-administered medications for this visit.  ? ? ? ?Musculoskeletal: ?Strength & Muscle Tone:  N/A ?Gait & Station:  N/A ?Patient leans: N/A ? ?Psychiatric Specialty Exam: ?Review of Systems  ?Psychiatric/Behavioral:  Negative  for agitation, behavioral problems, confusion, decreased concentration, dysphoric mood, hallucinations, self-injury, sleep disturbance and suicidal ideas. The patient is not nervous/anxious and is not hyperactive.   ?All other systems reviewed and are negative.  ?There were no vitals taken for this visit.There is no height or weight on file to calculate BMI.  ?General Appearance: Fairly Groomed  ?Eye Contact:  Fair  ?Speech:  Clear and Coherent  ?Volume:  Normal  ?Mood:   good  ?Affect:  Appropriate, Congruent, and Restricted  ?Thought Process:  Coherent  ?Orientation:  Other:  oriented to self  ?Thought Content: Logical   ?Suicidal Thoughts:  No  ?Homicidal Thoughts:  No  ?Memory:  Immediate;   Fair  ?Judgement:  Fair  ?Insight:  Shallow  ?Psychomotor Activity:  Normal  ?Concentration:  Concentration: Fair and Attention Span: Fair  ?Recall:  Poor  ?Fund of Knowledge: Good  ?Language: Good  ?Akathisia:  No  ?Handed:  Right  ?AIMS (if indicated): not done  ?Assets:  Social Support  ?ADL's:  Intact  ?Cognition: Impaired,  Mild  ?Sleep:  Good  ? ?Screenings: ?PHQ2-9   ? ?Flowsheet  Row Video Visit from 12/16/2020 in [place] Office Visit from 09/08/2020 in [place]  ?PHQ-2 Total Score 0 0  ? ?  ? ?Flowsheet Row Video Visit from 12/16/2020

## 2021-07-16 ENCOUNTER — Encounter: Payer: Self-pay | Admitting: Psychiatry

## 2021-07-16 ENCOUNTER — Telehealth (INDEPENDENT_AMBULATORY_CARE_PROVIDER_SITE_OTHER): Payer: Medicare Other | Admitting: Psychiatry

## 2021-07-16 DIAGNOSIS — G3184 Mild cognitive impairment, so stated: Secondary | ICD-10-CM

## 2021-07-16 DIAGNOSIS — F22 Delusional disorders: Secondary | ICD-10-CM | POA: Diagnosis not present

## 2021-07-16 NOTE — Patient Instructions (Signed)
1. Continue Rivastigmine 3 mg twice a day  ?2. Continue Abilify 2 mg daily  ?3. Next appointment:  9/12 at 2 PM for 30 mins, in person ? ?The next visit will be in person visit. Please arrive 15 mins before the scheduled time.  ? ?[facility]  ?Address: [address], [place], [place] [address]   ?

## 2021-08-17 ENCOUNTER — Other Ambulatory Visit: Payer: Self-pay | Admitting: Psychiatry

## 2021-10-13 ENCOUNTER — Ambulatory Visit: Payer: Self-pay | Admitting: Physician Assistant

## 2021-10-13 DIAGNOSIS — Z029 Encounter for administrative examinations, unspecified: Secondary | ICD-10-CM

## 2021-10-14 ENCOUNTER — Encounter: Payer: Self-pay | Admitting: Physician Assistant

## 2021-10-27 ENCOUNTER — Ambulatory Visit (INDEPENDENT_AMBULATORY_CARE_PROVIDER_SITE_OTHER): Payer: Medicare Other | Admitting: Physician Assistant

## 2021-10-27 ENCOUNTER — Encounter: Payer: Self-pay | Admitting: Physician Assistant

## 2021-10-27 VITALS — BP 164/68 | HR 64 | Resp 18 | Ht 68.0 in | Wt 155.0 lb

## 2021-10-27 DIAGNOSIS — F0280 Dementia in other diseases classified elsewhere without behavioral disturbance: Secondary | ICD-10-CM

## 2021-10-27 DIAGNOSIS — G309 Alzheimer's disease, unspecified: Secondary | ICD-10-CM

## 2021-10-27 NOTE — Progress Notes (Signed)
Assessment/Plan:   Dementia likely due to Alzheimer's Disease   Ariel Archer is a very pleasant 81 y.o. RH female seen today in follow up for memory loss. Patient is currently on rivastigmine 3 mg bid, tolerating well. She is stable from the cognitive standpoint.    Recommendations:    Continue  rivastigmine 3 mg bid   Side effects were discussed Continue follow up with Dr. Vanetta Shawl [facility]) Follow up in 6  months.   Case discussed with Dr. Karel Jarvis who agrees with the plan     Subjective:    This patient is accompanied in the office by her husband  who supplements the history.  Previous records as well as any outside records available were reviewed prior to todays visit.    Any changes in memory since last visit? No changes are reported. she enjoys playing Cards, puzzles. She writes things down to help her remember Patient lives with: Spouse repeats oneself?  Endorsed Disoriented when walking into a room?  Patient denies   Leaving objects in unusual places?  Patient denies   Ambulates  with difficulty?   Patient denies   Recent falls?  Patient denies   Any head injuries?  Patient denies   History of seizures?   Patient denies   Wandering behavior?  Patient denies   Patient drives?   Patient no longer drives  Any mood changes since last visit?  Patient denies   Any worsening depression?:  Patient denies   Hallucinations?  Patient denies   Paranoia?  Patient denies   Patient reports that  she sleeps well without vivid dreams, REM behavior or sleepwalking   History of sleep apnea?  Patient denies   Any hygiene concerns?  Patient denies   Independent of bathing and dressing?  Endorsed  Does the patient needs help with medications?  Denies Who is in charge of the finances?  Husband is in charge    Any changes in appetite?  Patient denies   Patient have trouble swallowing? Patient denies   Does the patient cook?  Patient denies   Any kitchen accidents such as leaving the stove  on? Patient denies   Any headaches?  Patient denies   Double vision? Patient denies   Any focal numbness or tingling?  Patient denies   Chronic back pain Patient denies   Unilateral weakness?  Patient denies   Any tremors?  Patient denies   Any history of anosmia?  Patient denies   Any incontinence of urine?  Patient denies   Any bowel dysfunction?   Patient denies        PREVIOUS MEDICATIONS:   CURRENT MEDICATIONS:  Outpatient Encounter Medications as of 10/27/2021  Medication Sig   ARIPiprazole (ABILIFY) 2 MG tablet Take 1 tablet (2 mg total) by mouth daily.   CRANBERRY PO Take 650 mg by mouth daily.   L-METHYLFOLATE CALCIUM PO Take 1 tablet by mouth daily.   lisinopril (ZESTRIL) 20 MG tablet Take by mouth daily.   Multi Vit-Fluoride-Folic Acid (MULTIVITAMIN/FLUORIDE PO) Take 0.25 mg by mouth daily.   rivastigmine (EXELON) 3 MG capsule Take 1 capsule (3 mg total) by mouth 2 (two) times daily.   rOPINIRole (REQUIP) 1 MG tablet Take 2 tablets by mouth daily.    No facility-administered encounter medications on file as of 10/27/2021.        No data to display             No data to display  Objective:     PHYSICAL EXAMINATION:    VITALS:   Vitals:   10/27/21 1455  BP: (!) 164/68  Pulse: 64  Resp: 18  SpO2: 97%  Weight: 155 lb (70.3 kg)  Height: 5\' 8"  (1.727 m)    GEN:  The patient appears stated age and is in NAD. HEENT:  Normocephalic, atraumatic.   Neurological examination:  General: NAD, well-groomed, appears stated age. Orientation: The patient is alert. Oriented to person, place and date Cranial nerves: There is good facial symmetry.The speech is fluent and clear. No aphasia or dysarthria. Fund of knowledge is appropriate. Recent and remote memory are impaired. Attention and concentration are reduced.  Able to name objects and repeat phrases.  Hearing is reduced, usus hearing aids.   Sensation: Sensation is intact to light touch  throughout Motor: Strength is at least antigravity x4. Tremors: none  DTR's 2/4 in UE/LE     Movement examination: Tone: There is normal tone in the UE/LE Abnormal movements:  no tremor.  No myoclonus.  No asterixis.   Coordination:  There is no decremation with RAM's. Normal finger to nose  Gait and Station: The patient has no difficulty arising out of a deep-seated chair without the use of the hands. The patient's stride length is good.  Gait is cautious and narrow.    Thank you for allowing the opportunity to participate in the care of this nice patient. Please do not hesitate to contact us for any questions or concerns.   Total time spent on today's visit was 20 minutes dedicated to this patient today, preparing to see patient, examining the patient, ordering tests and/or medications and counseling the patient, documenting clinical information in the EHR or other health record, independently interpreting results and communicating results to the patient/family, discussing treatment and goals, answering patient's questions and coordinating care.  Cc:  [place], [place]  [facility] 11/03/2021 1:59 PM

## 2021-10-27 NOTE — Patient Instructions (Signed)
Recommendations:  Good to see you. Continue Rivastigmine 3mg  twice a day. Continue follow-up with Dr. .Follow-up in 6 months, call for any changes It was a pleasure to see you today at our office.   Whom to call:  Memory  decline, memory medications: Call our office [phone]   For psychiatric meds, mood meds: Please have your primary care physician manage these medications.   Counseling regarding caregiver distress, including caregiver depression, anxiety and issues regarding community resources, adult day care programs, adult living facilities, or memory care questions:   Feel free to contact Misty [phone], Social Worker at [phone]   For assessment of decision of mental capacity and competency:  Call Dr. [phone], geriatric psychiatrist at [phone]  For guidance in geriatric dementia issues please call [facility] [phone]  For guidance regarding [facility] and if placement were needed at the facility, contact [phone], Social Worker tel: [phone]  If you have any severe symptoms of a stroke, or other severe issues such as confusion,severe chills or fever, etc call 911 or go to the ER as you may need to be evaluated further     RECOMMENDATIONS FOR ALL PATIENTS WITH MEMORY PROBLEMS: 1. Continue to exercise (Recommend 30 minutes of walking everyday, or 3 hours every week) 2. Increase social interactions - continue going to [place] and enjoy social gatherings with friends and family 3. Eat healthy, avoid fried foods and eat more fruits and vegetables 4. Maintain adequate blood pressure, blood sugar, and blood cholesterol level. Reducing the risk of stroke and cardiovascular disease also helps promoting better memory. 5. Avoid stressful situations. Live a simple life and avoid aggravations. Organize your time and prepare for the next day in anticipation. 6. Sleep well, avoid any interruptions of sleep and  avoid any distractions in the bedroom that may interfere with adequate sleep quality 7. Avoid sugar, avoid sweets as there is a strong link between excessive sugar intake, diabetes, and cognitive impairment We discussed the Mediterranean diet, which has been shown to help patients reduce the risk of progressive memory disorders and reduces cardiovascular risk. This includes eating fish, eat fruits and green leafy vegetables, nuts like almonds and hazelnuts, walnuts, and also use olive oil. Avoid fast foods and fried foods as much as possible. Avoid sweets and sugar as sugar use has been linked to worsening of memory function.  There is always a concern of gradual progression of memory problems. If this is the case, then we may need to adjust level of care according to patient needs. Support, both to the patient and caregiver, should then be put into place.      You have been referred for a neuropsychological evaluation (i.e., evaluation of memory and thinking abilities). Please bring someone with you to this appointment if possible, as it is helpful for the doctor to hear from both you and another adult who knows you well. Please bring eyeglasses and hearing aids if you wear them.    The evaluation will take approximately 3 hours and has two parts:   The first part is a clinical interview with the neuropsychologist (Dr. Caspe or Dr. Milbert Coulter). During the interview, the neuropsychologist will speak with you and the individual you brought to the appointment.    The second part of the evaluation is testing with the doctor's technician Roseanne Reno or Annabelle Harman). During the testing, the technician will ask you to remember different types of material, solve problems, and answer some questionnaires. Your  family member will not be present for this portion of the evaluation.   Please note: We must reserve several hours of the neuropsychologist's time and the psychometrician's time for your evaluation appointment. As such,  there is a No-Show fee of $100. If you are unable to attend any of your appointments, please contact our office as soon as possible to reschedule.    FALL PRECAUTIONS: Be cautious when walking. Scan the area for obstacles that may increase the risk of trips and falls. When getting up in the mornings, sit up at the edge of the bed for a few minutes before getting out of bed. Consider elevating the bed at the head end to avoid drop of blood pressure when getting up. Walk always in a well-lit room (use night lights in the walls). Avoid area rugs or power cords from appliances in the middle of the walkways. Use a walker or a cane if necessary and consider physical therapy for balance exercise. Get your eyesight checked regularly.  FINANCIAL OVERSIGHT: Supervision, especially oversight when making financial decisions or transactions is also recommended.  HOME SAFETY: Consider the safety of the kitchen when operating appliances like stoves, microwave oven, and blender. Consider having supervision and share cooking responsibilities until no longer able to participate in those. Accidents with firearms and other hazards in the house should be identified and addressed as well.   ABILITY TO BE LEFT ALONE: If patient is unable to contact 911 operator, consider using LifeLine, or when the need is there, arrange for someone to stay with patients. Smoking is a fire hazard, consider supervision or cessation. Risk of wandering should be assessed by caregiver and if detected at any point, supervision and safe proof recommendations should be instituted.  MEDICATION SUPERVISION: Inability to self-administer medication needs to be constantly addressed. Implement a mechanism to ensure safe administration of the medications.   DRIVING: Regarding driving, in patients with progressive memory problems, driving will be impaired. We advise to have someone else do the driving if trouble finding directions or if minor accidents  are reported. Independent driving assessment is available to determine safety of driving.   If you are interested in the driving assessment, you can contact the following:  [employer] in [place] [phone]  [facility] [phone]  [facility] [phone] or [phone]    Mediterranean Diet A Mediterranean diet refers to food and lifestyle choices that are based on the traditions of countries located on the [employer]. This way of eating has been shown to help prevent certain conditions and improve outcomes for people who have chronic diseases, like kidney disease and heart disease. What are tips for following this plan? Lifestyle  Cook and eat meals together with your family, when possible. Drink enough fluid to keep your urine clear or pale yellow. Be physically active every day. This includes: Aerobic exercise like running or swimming. Leisure activities like gardening, walking, or housework. Get 7-8 hours of sleep each night. If recommended by your health care provider, drink red wine in moderation. This means 1 glass a day for nonpregnant women and 2 glasses a day for men. A glass of wine equals 5 oz (150 mL). Reading food labels  Check the serving size of packaged foods. For foods such as rice and pasta, the serving size refers to the amount of cooked product, not dry. Check the total fat in packaged foods. Avoid foods that have saturated fat or trans fats. Check the  ingredients list for added sugars, such as corn syrup. Shopping  At the grocery store, buy most of your food from the areas near the walls of the store. This includes: Fresh fruits and vegetables (produce). Grains, beans, nuts, and seeds. Some of these may be available in unpackaged forms or large amounts (in bulk). Fresh seafood. Poultry and eggs. Low-fat dairy products. Buy whole ingredients instead of prepackaged  foods. Buy fresh fruits and vegetables in-season from local farmers markets. Buy frozen fruits and vegetables in resealable bags. If you do not have access to quality fresh seafood, buy precooked frozen shrimp or canned fish, such as tuna, salmon, or sardines. Buy small amounts of raw or cooked vegetables, salads, or olives from the deli or salad bar at your store. Stock your pantry so you always have certain foods on hand, such as olive oil, canned tuna, canned tomatoes, rice, pasta, and beans. Cooking  Cook foods with extra-virgin olive oil instead of using butter or other vegetable oils. Have meat as a side dish, and have vegetables or grains as your main dish. This means having meat in small portions or adding small amounts of meat to foods like pasta or stew. Use beans or vegetables instead of meat in common dishes like chili or lasagna. Experiment with different cooking methods. Try roasting or broiling vegetables instead of steaming or sauteing them. Add frozen vegetables to soups, stews, pasta, or rice. Add nuts or seeds for added healthy fat at each meal. You can add these to yogurt, salads, or vegetable dishes. Marinate fish or vegetables using olive oil, lemon juice, garlic, and fresh herbs. Meal planning  Plan to eat 1 vegetarian meal one day each week. Try to work up to 2 vegetarian meals, if possible. Eat seafood 2 or more times a week. Have healthy snacks readily available, such as: Vegetable sticks with hummus. Greek yogurt. Fruit and nut trail mix. Eat balanced meals throughout the week. This includes: Fruit: 2-3 servings a day Vegetables: 4-5 servings a day Low-fat dairy: 2 servings a day Fish, poultry, or lean meat: 1 serving a day Beans and legumes: 2 or more servings a week Nuts and seeds: 1-2 servings a day Whole grains: 6-8 servings a day Extra-virgin olive oil: 3-4 servings a day Limit red meat and sweets to only a few servings a month What are my food  choices? Mediterranean diet Recommended Grains: Whole-grain pasta. Brown rice. Bulgar wheat. Polenta. Couscous. Whole-wheat bread. [place]. Vegetables: Artichokes. Beets. Broccoli. Cabbage. Carrots. Eggplant. Green beans. Chard. Kale. Spinach. Onions. Leeks. Peas. Squash. Tomatoes. Peppers. Radishes. Fruits: Apples. Apricots. Avocado. Berries. Bananas. Cherries. Dates. Figs. Grapes. Lemons. Melon. Oranges. Peaches. Plums. Pomegranate. Meats and other protein foods: Beans. Almonds. Sunflower seeds. Pine nuts. Peanuts. [place]. Salmon. Scallops. Shrimp. [place]. Tilapia. Clams. Oysters. Eggs. Dairy: Low-fat milk. Cheese. Greek yogurt. Beverages: Water. Red wine. Herbal tea. Fats and oils: Extra virgin olive oil. Avocado oil. Grape seed oil. Sweets and desserts: [place] yogurt with honey. Baked apples. Poached pears. Trail mix. Seasoning and other foods: Basil. Cilantro. Coriander. Cumin. Mint. Parsley. Sage. Rosemary. Tarragon. Garlic. Oregano. Thyme. Pepper. Balsalmic vinegar. Tahini. Hummus. Tomato sauce. Olives. Mushrooms. Limit these Grains: Prepackaged pasta or rice dishes. Prepackaged cereal with added sugar. Vegetables: Deep fried potatoes (french fries). Fruits: Fruit canned in syrup. Meats and other protein foods: Beef. Pork. Lamb. Poultry with skin. Hot dogs. Bernie[place] Salines. Dairy: Ice cream. Sour cream. Whole milk. Beverages: Juice. Sugar-sweetened soft drinks. Beer. Liquor and spirits. Fats and oils: Butter. Canola  oil. Vegetable oil. Beef fat (tallow). Lard. Sweets and desserts: Cookies. Cakes. Pies. Candy. Seasoning and other foods: Mayonnaise. Premade sauces and marinades. The items listed may not be a complete list. Talk with your dietitian about what dietary choices are right for you. Summary The Mediterranean diet includes both food and lifestyle choices. Eat a variety of fresh fruits and vegetables, beans, nuts, seeds, and whole grains. Limit the amount of red meat and sweets that  you eat. Talk with your health care provider about whether it is safe for you to drink red wine in moderation. This means 1 glass a day for nonpregnant women and 2 glasses a day for men. A glass of wine equals 5 oz (150 mL). This information is not intended to replace advice given to you by your health care provider. Make sure you discuss any questions you have with your health care provider. Document Released: 10/23/2015 Document Revised: 11/25/2015 Document Reviewed: 10/23/2015 Elsevier Interactive Patient Education  2017 [employer]      FALL PRECAUTIONS: Be cautious when walking. Scan the area for obstacles that may increase the risk of trips and falls. When getting up in the mornings, sit up at the edge of the bed for a few minutes before getting out of bed. Consider elevating the bed at the head end to avoid drop of blood pressure when getting up. Walk always in a well-lit room (use night lights in the walls). Avoid area rugs or power cords from appliances in the middle of the walkways. Use a walker or a cane if necessary and consider physical therapy for balance exercise. Get your eyesight checked regularly.  HOME SAFETY: Consider the safety of the kitchen when operating appliances like stoves, microwave oven, and blender. Consider having supervision and share cooking responsibilities until no longer able to participate in those. Accidents with firearms and other hazards in the house should be identified and addressed as well.  ABILITY TO BE LEFT ALONE: If patient is unable to contact 911 operator, consider using LifeLine, or when the need is there, arrange for someone to stay with patients. Smoking is a fire hazard, consider supervision or cessation. Risk of wandering should be assessed by caregiver and if detected at any point, supervision and safe proof recommendations should be instituted.   RECOMMENDATIONS FOR ALL PATIENTS WITH MEMORY PROBLEMS: 1. Continue to exercise (Recommend 30  minutes of walking everyday, or 3 hours every week) 2. Increase social interactions - continue going to [place] and enjoy social gatherings with friends and family 3. Eat healthy, avoid fried foods and eat more fruits and vegetables 4. Maintain adequate blood pressure, blood sugar, and blood cholesterol level. Reducing the risk of stroke and cardiovascular disease also helps promoting better memory. 5. Avoid stressful situations. Live a simple life and avoid aggravations. Organize your time and prepare for the next day in anticipation. 6. Sleep well, avoid any interruptions of sleep and avoid any distractions in the bedroom that may interfere with adequate sleep quality 7. Avoid sugar, avoid sweets as there is a strong link between excessive sugar intake, diabetes, and cognitive impairment The Mediterranean diet has been shown to help patients reduce the risk of progressive memory disorders and reduces cardiovascular risk. This includes eating fish, eat fruits and green leafy vegetables, nuts like almonds and hazelnuts, walnuts, and also use olive oil. Avoid fast foods and fried foods as much as possible. Avoid sweets and sugar as sugar use has been linked to worsening of memory function.  There  is always a concern of gradual progression of memory problems. If this is the case, then we may need to adjust level of care according to patient needs. Support, both to the patient and caregiver, should then be put into place.

## 2021-11-07 ENCOUNTER — Other Ambulatory Visit: Payer: Self-pay | Admitting: Psychiatry

## 2021-11-24 ENCOUNTER — Ambulatory Visit: Payer: Medicare Other | Admitting: Psychiatry

## 2021-11-25 NOTE — Progress Notes (Signed)
BH MD/PA/NP OP Progress Note  11/26/2021 4:08 PM Ariel Archer  MRN:  [id number]  Chief Complaint:  Chief Complaint  Patient presents with   Follow-up   HPI:  This is a follow-up appointment for neurocognitive disorder and the paranoia.  She states that she has been "take it easy." With the help of her husband, she states that she takes a walk for a mile. She may do puzzle. She sees her son and daughter at times.  She was unable to tell where they live [facility] and [place]).  She denies any concern about her neighbors.  She denies feeling depressed or anxiety.  She denies AH, VH, paranoia.  She denies any dizziness or fall.   Ariel Archer, her husband presents to the interview.  He states that there has been no change since the last visit.  No safety concerns.  Her memory has been the same.   She continues to be able to take care of herself.   He does not see much difference in her response when she speaks in [place] versus in [name].  He asks if they need to come to both psychiatry and neurology.  According to the chart review, she would be referred to neurologist by psychologist.  He was advised to discuss this with neurologist, although this writer can prescribe both of the medication.    Functional Status Instrumental Activities of Daily Living (IADLs):  Ariel Archer is independent in the following: Requires assistance with the following: managing finances, medications Ariel Archer will put medication in pillbox), cooking  Activities of Daily Living (ADLs):  Ariel Archer is independent in the following: bathing and hygiene, feeding, continence, grooming and toileting, walking   Employment: retired, used to work at [employer] for seven years at age 35, used to work at [employer] adult [employer]  Support: husband Household: husband Marital status: married Number of children: 2 Born and grew up in [place], moved to [place]. In [place], moved from [place] to [place] 7 years ago where her cousin lives.   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 Paranoia (HCC)  F22       Past Psychiatric History: Please see initial evaluation for full details. I have reviewed the history. No updates at this time.     Past Medical History:  Past Medical History:  Diagnosis Date   Essential hypertension 06/12/2013   SBP is a bit high today. 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 Slowly progressive bilateral hearing loss.  People around her are getting more more frustrated.  There is some component of dementia.  No history of ear surgery, trauma or infection. EXAMINATION shows normal external canals and tympanic membranes. 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  Family Psychiatric History:  Please see initial evaluation for full details. I have reviewed the history. No updates at this time.     Family History: No family history on file.  Social History:  Social History   Socioeconomic History   Marital status: Married    Spouse name: Not on file   Number of children: 2   Years of education: 12   Highest education level: High school graduate  Occupational History   Not on file  Tobacco Use   Smoking status: Never   Smokeless tobacco: Never  Vaping Use   Vaping Use: Never used  Substance  and Sexual Activity   Alcohol use: No    Comment: 06-09-2016 per pt rarely   Drug use: No   Sexual activity: Not Currently  Other Topics Concern   Not on file  Social History Narrative   Right handed    Lives with husband   Social Determinants of Health   Financial Resource Strain: Low Risk  (04/08/2018)   Overall Financial Resource Strain (CARDIA)    Difficulty of Paying Living Expenses: Not hard at all  Food Insecurity: No Food Insecurity (04/08/2018)   Hunger Vital Sign    Worried About Running Out of Food in the Last Year: Never true    Ran Out of Food in the Last Year: Never true  Transportation Needs: No Transportation Needs (04/08/2018)   PRAPARE - Administrator, Civil Service (Medical): No    Lack of Transportation (Non-Medical): No  Physical Activity: Insufficiently Active (04/08/2018)   Exercise Vital Sign    Days of Exercise per Week: 2 days    Minutes of Exercise per Session: 30 min  Stress: No Stress Concern Present (04/08/2018)   Ariel Archer [employer] - Occupational Stress Questionnaire    Feeling of Stress : Not at all  Social Connections: Unknown (04/08/2018)   Social Connection and Isolation Panel [NHANES]    Frequency of Communication with Friends and Family: Not on file    Frequency of Social Gatherings with Friends and Family: Not on file    Attends Religious Services: Never    Active Member of Clubs or Organizations: Yes    Attends Engineer, structural: More than 4 times per year    Marital Status: Married    Allergies: No Known Allergies  Metabolic Disorder Labs: Lab Results  Component Value Date   HGBA1C 5.4 06/10/2016   MPG 108 06/10/2016   No results found for: "PROLACTIN" Lab Results  Component Value Date   CHOL 192 06/10/2016   TRIG 106 06/10/2016   HDL 57 06/10/2016   CHOLHDL 3.4 06/10/2016   VLDL 21 06/10/2016   LDLCALC 114 (H) 06/10/2016   Lab Results  Component Value Date   TSH 1.00 06/10/2016     Therapeutic Level Labs: No results found for: "LITHIUM" No results found for: "VALPROATE" No results found for: "CBMZ"  Current Medications: Current Outpatient Medications  Medication Sig Dispense Refill   ARIPiprazole (ABILIFY) 2 MG tablet TAKE ONE TABLET BY MOUTH ONE TIME DAILY 90 tablet 0   CRANBERRY PO Take 650 mg by mouth daily.     L-METHYLFOLATE CALCIUM PO Take 1 tablet by mouth daily.     lisinopril (ZESTRIL) 20 MG tablet Take by mouth daily.  3   Multi Vit-Fluoride-Folic Acid (MULTIVITAMIN/FLUORIDE PO) Take 0.25 mg by mouth daily.     rivastigmine (EXELON) 3 MG capsule Take 1 capsule (3 mg total) by mouth 2 (two) times daily. 180  capsule 3   rOPINIRole (REQUIP) 1 MG tablet Take 2 tablets by mouth daily.      No current facility-administered medications for this visit.     Musculoskeletal: Strength & Muscle Tone: within normal limits Gait & Station: normal Patient leans: N/A  Psychiatric Specialty Exam: Review of Systems  Psychiatric/Behavioral: Negative.    All other systems reviewed and are negative.   Blood pressure (!) 142/67, pulse 60, height 5\' 8"  (1.727 m), weight 152 lb (68.9 kg).Body mass index is 23.11 kg/m.  General Appearance: Fairly Groomed  Eye Contact:  Good  Speech:  Clear and Coherent and speaks in one word on most questions  Volume:  Normal  Mood:   good  Affect:  Appropriate, Congruent, and calm  Thought Process:  Coherent  Orientation:  Full (Time, Place, and Person)  Thought Content: Logical   Suicidal Thoughts:  No  Homicidal Thoughts:  No  Memory:  Immediate;   Good  Judgement:  Fair  Insight:  Shallow  Psychomotor Activity:  Normal. Slightly increased tonus on bilateral arms. No cogwheel rigidity, no resting tremors. +postural tremors on bilateral hands  Concentration:  Concentration: Good and Attention Span: Good  Recall:  Poor  Fund of Knowledge: Good  Language: Good  Akathisia:  No  Handed:  Right  AIMS (if indicated): not  done  Assets:  Communication Skills Desire for Improvement  ADL's:  Intact  Cognition: WNL  Sleep:  Good   Screenings: GAD-7    Flowsheet Row Office Visit from 11/26/2021 in [facility]  Total GAD-7 Score 0      PHQ2-9    Flowsheet Row Office Visit from 11/26/2021 in [facility] Video Visit from 12/16/2020 in [facility] Office Visit from 09/08/2020 in [facility]  PHQ-2 Total Score 0 0 0      Flowsheet Row Video Visit from 12/16/2020 in [facility]  C-SSRS RISK CATEGORY No Risk        Assessment and Plan:  Wynelle Dreier is a 82 y.o. year old female with a history of mild neurocognitive disorder, delusion, hypertension, restless leg,  lumbar degenerative disc disease, who presents for follow up appointment for below.    1. Mild neurocognitive disorder Although she does continue to have a delayed recall and lack of insight into her memory, it has been stable overall. Neuropsych eval in 2021 showed concern of Alzheimer's disease.  Will continue current dose of rivastigmine to target neurocognitive disorder.  She does not have any fall or dizziness; will continue to monitor any bradycardia.   2. Paranoia (HCC) She denies any paranoia or neuropsychiatric symptoms since being on Abilify.  Will continue current dose of Abilify to target paranoia. Noted that the dose was tapered down as she had cogwheel rigidity on her bilateral arms in the past.  Will continue to monitor this.  Plan  1. Continue Rivastigmine 3 mg twice a day  2. Continue Abilify 2 mg daily  3. Next appointment:  3/5 at 3 PM for 30 mins, in person,  [email or web address] **- Folate, TSH, vitamin B 12 was ordered, but they have not checked them yet    The patient demonstrates the following risk factors for suicide: Chronic risk factors for suicide include: psychiatric disorder  of paranoia. Acute risk factors for suicide include: unemployment and recent discharge from inpatient psychiatry. Protective factors for this patient include: positive social support, coping skills and hope for the future. Considering these  factors, the overall suicide risk at this point appears to be low. Patient is appropriate for outpatient follow up.     Collaboration of Care: Collaboration of Care: Other reviewed notes in Epic  Patient/Guardian was advised Release of Information must be obtained prior to any record release in order to collaborate their care with an outside provider. Patient/Guardian was advised if they have not already done so to contact the registration department to sign all necessary forms in order for [place] to release information regarding their care.   Consent: Patient/Guardian gives verbal consent for treatment and assignment of benefits for services provided during this visit. Patient/Guardian expressed understanding and agreed to proceed.    Neysa Hotter, MD 11/26/2021, 4:08 PM

## 2021-11-26 ENCOUNTER — Encounter: Payer: Self-pay | Admitting: Psychiatry

## 2021-11-26 ENCOUNTER — Ambulatory Visit (INDEPENDENT_AMBULATORY_CARE_PROVIDER_SITE_OTHER): Payer: Medicare Other | Admitting: Psychiatry

## 2021-11-26 VITALS — BP 142/67 | HR 60 | Ht 68.0 in | Wt 152.0 lb

## 2021-11-26 DIAGNOSIS — F22 Delusional disorders: Secondary | ICD-10-CM

## 2021-11-26 DIAGNOSIS — G3184 Mild cognitive impairment, so stated: Secondary | ICD-10-CM | POA: Diagnosis not present

## 2021-11-26 MED ORDER — ARIPIPRAZOLE 2 MG PO TABS: 2.0000 mg | ORAL_TABLET | Freq: Every day | ORAL | 1 refills | Status: DC

## 2022-04-16 ENCOUNTER — Ambulatory Visit: Payer: Medicare Other | Admitting: Neurology

## 2022-04-29 ENCOUNTER — Ambulatory Visit: Payer: Medicare Other | Admitting: Physician Assistant

## 2022-05-07 NOTE — Progress Notes (Unsigned)
BH MD/PA/NP OP Progress Note  05/11/2022 4:09 PM Ariel Archer  MRN:  [id number]  Chief Complaint:  Chief Complaint  Patient presents with   Follow-up   HPI:  - she was seen by her PCP since the last visit "Patient presents to the office today to follow-up after recent emergency room visit. According to husband, patient was experiencing some left flank back pain. She had taken some pain reliever prior to bed, but woke up around 2 AM with pain. Today ended up going to the emergency room for evaluation. Once they got to the emergency room, pain had resolved, normal exam, normal CT scan. They were sent home. Since then there has been no recurrence of pain, overall she is doing well. "  This is a follow-up appointment for neurocognitive disorder and paranoia.  Most of the question was asked with the help of her husband, Ariel Archer due to hearing loss.  She states that she has been doing fine.  She had a good Christmas with her children. Although she may feel upset at times, she denies any concern.  She denies feeling depressed or anxiety.  She denies SI.  She denies paranoia.  She denies hallucinations.   Orientation- "[place] (she used to live in [place]), December, two thousand something"  Ariel Archer wonders if rivastigmine can be up titrated.  He is concerned about forgetfulness, which has been slightly getting worse.  There is no issues with ADL, and he denies any safety concern.  She takes a walk, playing cards, and is able to have conversation with Ariel Archer. She has an appointment with her neurologist tomorrow.    Wt Readings from Last 3 Encounters:  05/11/22 151 lb 3.2 oz (68.6 kg)  11/26/21 152 lb (68.9 kg)  10/27/21 155 lb (70.3 kg)     Visit Diagnosis:    ICD-10-CM   1. Paranoia ([place])  F22 EKG 12-Lead    2. 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 I have reviewed the history. No updates at this  time.     Past Medical History:  Past Medical History:  Diagnosis Date   Essential hypertension 06/12/2013   SBP is a bit high today. 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 Slowly progressive bilateral hearing loss.  People around her are getting more more frustrated.  There is some component of dementia.  No history of ear surgery, trauma or infection. EXAMINATION shows normal external canals and tympanic membranes. 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 No pertinent surgical history.  Family Psychiatric History: Please see initial evaluation for full details. I have reviewed the history. No updates at this time.     Family History: History reviewed. No pertinent family history.  Social History:  Social History   Socioeconomic History   Marital status: Married    Spouse name: Not on file   Number of children: 2   Years  of education: 12   Highest education level: High school graduate  Occupational History   Not on file  Tobacco Use   Smoking status: Never   Smokeless tobacco: Never  Vaping Use   Vaping Use: Never used  Substance and Sexual Activity   Alcohol use: No    Comment: 06-09-2016 per pt rarely   Drug use: No   Sexual activity: Not Currently  Other Topics Concern   Not on file  Social History Narrative   Right handed    Lives with husband   Social Determinants of Health   Financial Resource Strain: Low Risk  (04/08/2018)    Overall Financial Resource Strain (CARDIA)    Difficulty of Paying Living Expenses: Not hard at all  Food Insecurity: No Food Insecurity (04/08/2018)   Hunger Vital Sign    Worried About Running Out of Food in the Last Year: Never true    Ran Out of Food in the Last Year: Never true  Transportation Needs: No Transportation Needs (04/08/2018)   PRAPARE - Hydrologist (Medical): No    Lack of Transportation (Non-Medical): No  Physical Activity: Insufficiently Active (04/08/2018)   Exercise Vital Sign    Days of Exercise per Week: 2 days    Minutes of Exercise per Session: 30 min  Stress: No Stress Concern Present (04/08/2018)   [place]    Feeling of Stress : Not at all  Social Connections: Unknown (04/08/2018)   Social Connection and Isolation Panel [NHANES]    Frequency of Communication with Friends and Family: Not on file    Frequency of Social Gatherings with Friends and Family: Not on file    Attends Religious Services: Never    Active Member of [place] or Organizations: Yes    Attends Music therapist: More than 4 times per year    Marital Status: Married    Allergies: No Known Allergies  Metabolic Disorder Labs: Lab Results  Component Value Date   HGBA1C 5.4 06/10/2016   MPG 108 06/10/2016   No results found for: "PROLACTIN" Lab Results  Component Value Date   CHOL 192 06/10/2016   TRIG 106 06/10/2016   HDL 57 06/10/2016   CHOLHDL 3.4 06/10/2016   VLDL 21 06/10/2016   LDLCALC 114 (H) 06/10/2016   Lab Results  Component Value Date   TSH 1.00 06/10/2016    Therapeutic Level Labs: No results found for: "LITHIUM" No results found for: "VALPROATE" No results found for: "CBMZ"  Current Medications: Current Outpatient Medications  Medication Sig Dispense Refill   ARIPiprazole (ABILIFY) 2 MG tablet Take 1 tablet (2 mg total) by mouth daily. 90 tablet 1    CRANBERRY PO Take 650 mg by mouth daily.     L-METHYLFOLATE CALCIUM PO Take 1 tablet by mouth daily.     lisinopril (ZESTRIL) 20 MG tablet Take by mouth daily.  3   Multi Vit-Fluoride-Folic Acid (MULTIVITAMIN/FLUORIDE PO) Take 0.25 mg by mouth daily.     rivastigmine (EXELON) 3 MG capsule Take 1 capsule (3 mg total) by mouth 2 (two) times daily. 180 capsule 3   rOPINIRole (REQUIP) 1 MG tablet Take 2 tablets by mouth daily.      No current facility-administered medications for this visit.     Musculoskeletal: Strength & Muscle Tone: within normal limits Gait & Station: normal Patient leans: N/A  Psychiatric Specialty Exam: Review of Systems  Psychiatric/Behavioral: Negative.  All other systems reviewed and are negative.   Blood pressure 137/70, pulse 69, temperature 97.9 F (36.6 C), temperature source Skin, height '5\' 8"'$  (1.727 m), weight 151 lb 3.2 oz (68.6 kg).Body mass index is 22.99 kg/m.  General Appearance: Fairly Groomed  Eye Contact:  Good  Speech:  Clear and Coherent  Volume:  Normal  Mood:   good  Affect:  Appropriate, Congruent, and calm  Thought Process:  Coherent  Orientation:  Other:  oriented to self  Thought Content: Logical   Suicidal Thoughts:  No  Homicidal Thoughts:  No  Memory:  Immediate;   Fair  Judgement:  Fair  Insight:  Lacking  Psychomotor Activity:  Normal, Normal tone, no rigidity, no resting/postural tremors, no tardive dyskinesia    Concentration:  Concentration: Good and Attention Span: Good  Recall:  Poor  Fund of Knowledge: Good  Language: Good  Akathisia:  No  Handed:  Right  AIMS (if indicated): not done  Assets:  Communication Skills Desire for Improvement  ADL's:  Intact  Cognition: Impaired,  Moderate  Sleep:  Good   Screenings: GAD-7    Flowsheet Row Office Visit from 11/26/2021 in [place]  Total GAD-7 Score 0      PHQ2-9    [place] Office Visit from 11/26/2021 in  [place] Video Visit from 12/16/2020 in [place] Office Visit from 09/08/2020 in [place]  PHQ-2 Total Score 0 0 0      Flowsheet Row Video Visit from 12/16/2020 in [place] No Risk        Assessment and Plan:  Ariel Archer is a 82 y.o. year old female with a history of mild neurocognitive disorder, delusion, hypertension, restless leg,  lumbar degenerative disc disease, who presents for follow up appointment for below.   2. Mild neurocognitive disorder  Functional Status Instrumental Activities of Daily Living (IADLs):  Ariel Archer is independent in the following: Requires assistance with the following: managing finances, medications Ariel Archer will put medication in pillbox), cooking Activities of Daily Living (ADLs):  Ariel Archer is independent in the following: bathing and hygiene, feeding, continence, grooming and toileting, walking  Folate, Vtamin B12, TSH (wnl 04/2022) Head CT 11/2020 Cerebral volume loss with ex vacuo dilatation of the ventricular system and enlargement of the cerebral sulci. Atrophy of the bilateral hippocampi. Periventricular and deep cerebral white matter hypoattenuation likely the sequela of chronic ischemic microvascular disease.  Neuropsych assessment: Neuropsych eval in 2021 showed concern of Alzheimer's disease Etiology:  alzheimer, r/o vascular  Her husband reports slight worsening in memory loss, although he denies any other behavior concern.  Although uptitration of rivastigmine is considered, this medication has been adjusted by her neurologist, and they do have an appointment tomorrow.  Will defer this for further uptitration while monitoring bradycardia.  Will obtain labs to rule out treatable causes of dementia.   1. Paranoia ([place]) She denies any paranoia, or  other psychotic symptoms since being on Abilify.  Will continue current dose to target paranoia.    Plan  Continue Rivastigmine 3 mg twice a day  Continue Abilify 2 mg daily  Obtain EKG  Obtain lab (vitamin B12, folate) Next appointment: 8/13 at 4 pm for 30 mins, IP    The patient demonstrates the following risk factors for suicide: Chronic risk factors for suicide include: psychiatric disorder of paranoia.  Acute risk factors for suicide include: unemployment and recent discharge from inpatient psychiatry. Protective factors for this patient include: positive social support, coping skills and hope for the future. Considering these factors, the overall suicide risk at this point appears to be low. Patient is appropriate for outpatient follow up.      Collaboration of Care: Collaboration of Care: Other reviewed notes in Epic  Patient/Guardian was advised Release of Information must be obtained prior to any record release in order to collaborate their care with an outside provider. Patient/Guardian was advised if they have not already done so to contact the registration department to sign all necessary forms in order for [place] to release information regarding their care.   Consent: Patient/Guardian gives verbal consent for treatment and assignment of benefits for services provided during this visit. Patient/Guardian expressed understanding and agreed to proceed.    Norman Clay, MD 05/11/2022, 4:09 PM

## 2022-05-11 ENCOUNTER — Encounter: Payer: Self-pay | Admitting: Psychiatry

## 2022-05-11 ENCOUNTER — Other Ambulatory Visit
Admission: RE | Admit: 2022-05-11 | Discharge: 2022-05-11 | Disposition: A | Discharge status: Home / Self Care | Payer: Medicare Other | Attending: Psychiatry | Admitting: Psychiatry

## 2022-05-11 ENCOUNTER — Ambulatory Visit (INDEPENDENT_AMBULATORY_CARE_PROVIDER_SITE_OTHER): Payer: Medicare Other | Admitting: Psychiatry

## 2022-05-11 VITALS — BP 137/70 | HR 69 | Temp 97.9°F | Ht 68.0 in | Wt 151.2 lb

## 2022-05-11 DIAGNOSIS — G3184 Mild cognitive impairment, so stated: Secondary | ICD-10-CM | POA: Diagnosis present

## 2022-05-11 DIAGNOSIS — F22 Delusional disorders: Secondary | ICD-10-CM

## 2022-05-11 LAB — FOLATE: Folate: 30 ng/mL (ref >5.9)

## 2022-05-11 MED ORDER — ARIPIPRAZOLE 2 MG PO TABS: 2.0000 mg | ORAL_TABLET | Freq: Every day | ORAL | 0 refills | Status: DC

## 2022-05-12 ENCOUNTER — Ambulatory Visit (INDEPENDENT_AMBULATORY_CARE_PROVIDER_SITE_OTHER): Payer: Medicare Other | Admitting: Physician Assistant

## 2022-05-12 VITALS — BP 157/99 | HR 60 | Resp 20 | Ht 68.0 in | Wt 150.0 lb

## 2022-05-12 DIAGNOSIS — F0280 Dementia in other diseases classified elsewhere without behavioral disturbance: Secondary | ICD-10-CM | POA: Diagnosis not present

## 2022-05-12 DIAGNOSIS — G309 Alzheimer's disease, unspecified: Secondary | ICD-10-CM

## 2022-05-12 LAB — VITAMIN B12: Vitamin B-12: 708 pg/mL (ref 180–914)

## 2022-05-12 MED ORDER — RIVASTIGMINE TARTRATE 4.5 MG PO CAPS: 4.5000 mg | ORAL_CAPSULE | Freq: Two times a day (BID) | ORAL | 3 refills | Status: DC

## 2022-05-12 NOTE — Progress Notes (Signed)
Assessment/Plan:   Major neurocognitive disorder due to Alzheimer's disease  Ariel Archer is a very pleasant 82 y.o. RH female with a history of hypertension, DJD, osteopenia, paranoia followed by behavioral health, and a history of major neurocognitive disorder due to Alzheimer's disease seen today in follow up for memory loss. Patient is currently on . MRI brain personally reviewed was remarkable for rivastigmine 3 mg twice daily.  MMSE today, in [place], is 10/30.  Overall, her cognitive status had declined.  She needs some help with some activities of daily living, but her husband feels that she remains safe at home.  He may consider memory care when she needs more help.  Follow up in 6  months. Increased rivastigmine to 4.5 mg twice daily, side effects discussed.  Ideally, would like to increase it to 9 mg twice daily.  Her husband will inform us if there is any worsening of her condition over the next 2 or 3 months. Continue behavioral health visits with Dr.Hisada (paranoia) patient is on Abilify 2 mg daily. Continue good control of cardiovascular risk factors Recommend hearing evaluation for hearing loss and adjustment of hearing aids    Subjective:    This patient is accompanied in the office by her husband who supplements the history.  Previous records as well as any outside records available were reviewed prior to todays visit. Patient was last seen on 10/27/2021.   Any changes in memory since last visit?  She is able to participate in a conversation, may forget names of people, although husband reports that her memory may be "a tiny worse ".  She enjoys playing cards and puzzles, she has to write things down to help her remember.  She [facility] her [facility] the [facility] a [facility].  Repeats oneself?  Endorsed Disoriented when walking into a room?  Patient denies Leaving objects in unusual places?    denies   Wandering behavior?  denies   Any personality changes since  last visit?  denies   Any worsening depression?:  denies   Hallucinations or paranoia?  denies   Seizures?    denies    Any sleep changes? Sleeps well unless the cat wakes her up.  Denies vivid dreams, REM behavior or sleepwalking   Sleep apnea?   denies   Any hygiene concerns?  denies   Independent of bathing and dressing?  Endorsed  Does the patient needs help with medications? husband is in charge  Who is in charge of the finances?  Husband is in charge    Any changes in appetite?  denies    Patient have trouble swallowing?  denies   Does the patient cook? Husband does the cooking   Any kitchen accidents such as leaving the stove on? Patient denies   Any headaches?   denies   Chronic back pain  denies   Ambulates with difficulty?  She walks 1 mile on the treadmill daily   Recent falls or head injuries? denies     Unilateral weakness, numbness or tingling?    denies   Any tremors?  denies   Any anosmia?  Patient denies   Any incontinence of urine?  denies   Any bowel dysfunction?     denies      Patient lives  with husband . Looking into a memory care if her condition were to worsen.  Does the patient drive?No longer drives    PREVIOUS MEDICATIONS:   CURRENT MEDICATIONS:  Outpatient Encounter Medications  as of 05/12/2022  Medication Sig   [START ON 08/06/2022] ARIPiprazole (ABILIFY) 2 MG tablet Take 1 tablet (2 mg total) by mouth daily.   CRANBERRY PO Take 650 mg by mouth daily.   L-METHYLFOLATE CALCIUM PO Take 1 tablet by mouth daily.   lisinopril (ZESTRIL) 20 MG tablet Take by mouth daily.   Multi Vit-Fluoride-Folic Acid (MULTIVITAMIN/FLUORIDE PO) Take 0.25 mg by mouth daily.   rOPINIRole (REQUIP) 1 MG tablet Take 2 tablets by mouth daily.    [DISCONTINUED] rivastigmine (EXELON) 3 MG capsule Take 1 capsule (3 mg total) by mouth 2 (two) times daily.   rivastigmine (EXELON) 4.5 MG capsule Take 1 capsule (4.5 mg total) by mouth 2 (two) times daily.   No facility-administered  encounter medications on file as of 05/12/2022.       05/12/2022    3:00 PM  MMSE - Mini Mental State Exam  Orientation to time 0  Orientation to Place 0  Registration 3  Attention/ Calculation 0  Recall 0  Language- name 2 objects 2  Language- repeat 1  Language- follow 3 step command 3  Language- read & follow direction 1  Write a sentence 0  Copy design 0  Total score 10       No data to display          Objective:     PHYSICAL EXAMINATION:    VITALS:   Vitals:   05/12/22 1422  BP: (!) 157/99  Pulse: 60  Resp: 20  SpO2: 100%  Weight: 150 lb (68 kg)  Height: '5\' 8"'$  (1.727 m)    GEN:  The patient appears stated age and is in NAD. HEENT:  Normocephalic, atraumatic.   Neurological examination:  General: NAD, well-groomed, appears stated age. Orientation: The patient is alert. Oriented to person, not to place or date Cranial nerves: There is good facial symmetry.The speech is fluent in [place] and clear. No aphasia or dysarthria. Fund of knowledge is reduced.  Recent and remote memory are impaired. Attention and concentration are reduced.  Able to name objects and repeat phrases.  Hearing is reduced to conversational tone.    Sensation: Sensation is intact to light touch throughout Motor: Strength is at least antigravity x4. DTR's 2/4 in UE/LE     Movement examination: Tone: There is normal tone in the UE/LE Abnormal movements:  no tremor.  No myoclonus.  No asterixis.   Coordination:  There is no decremation with RAM's. Normal finger to nose, however she needs to be instructed several times to perform. Gait and Station: The patient has no difficulty arising out of a deep-seated chair without the use of the hands. The patient's stride length is good.  Gait is cautious and narrow.    Thank you for allowing [place] the opportunity to participate in the care of this nice patient. Please do not hesitate to contact us for any questions or concerns.   Total time spent  on today's visit was 30 minutes dedicated to this patient today, preparing to see patient, examining the patient, ordering tests and/or medications and counseling the patient, documenting clinical information in the EHR or other health record, independently interpreting results and communicating results to the patient/family, discussing treatment and goals, answering patient's questions and coordinating care.  Cc:  Gracy Bruins [place], [place]  Clarise Cruz [facility] 05/12/2022 3:36 PM

## 2022-05-12 NOTE — Patient Instructions (Addendum)
Recommendations:  Good to see you. Continue Rivastigmine 4.5 mg twice a day. Continue follow-up with Dr. Modesta Messing. Follow-up in 6 months, call for any changes It was a pleasure to see you today at our office.   Whom to call:  Memory  decline, memory medications: Call our office [phone]   For psychiatric meds, mood meds: Please have your primary care physician manage these medications.   Counseling regarding caregiver distress, including caregiver depression, anxiety and issues regarding community resources, adult day care programs, adult living facilities, or memory care questions:   Feel free to contact [place], Social Worker at [phone]   For assessment of decision of mental capacity and competency:  Call Dr. Anthoney Harada, geriatric psychiatrist at [phone]  For guidance in geriatric dementia issues please call [facility] [phone]  For guidance regarding [facility] and if placement were needed at the facility, contact Arnell Asal, Social Worker tel: [phone]  If you have any severe symptoms of a stroke, or other severe issues such as confusion,severe chills or fever, etc call 911 or go to the ER as you may need to be evaluated further     RECOMMENDATIONS FOR ALL PATIENTS WITH MEMORY PROBLEMS: 1. Continue to exercise (Recommend 30 minutes of walking everyday, or 3 hours every week) 2. Increase social interactions - continue going to [place] and enjoy social gatherings with friends and family 3. Eat healthy, avoid fried foods and eat more fruits and vegetables 4. Maintain adequate blood pressure, blood sugar, and blood cholesterol level. Reducing the risk of stroke and cardiovascular disease also helps promoting better memory. 5. Avoid stressful situations. Live a simple life and avoid aggravations. Organize your time and prepare for the next day in anticipation. 6. Sleep well, avoid any interruptions of sleep  and avoid any distractions in the bedroom that may interfere with adequate sleep quality 7. Avoid sugar, avoid sweets as there is a strong link between excessive sugar intake, diabetes, and cognitive impairment We discussed the Mediterranean diet, which has been shown to help patients reduce the risk of progressive memory disorders and reduces cardiovascular risk. This includes eating fish, eat fruits and green leafy vegetables, nuts like almonds and hazelnuts, walnuts, and also use olive oil. Avoid fast foods and fried foods as much as possible. Avoid sweets and sugar as sugar use has been linked to worsening of memory function.  There is always a concern of gradual progression of memory problems. If this is the case, then we may need to adjust level of care according to patient needs. Support, both to the patient and caregiver, should then be put into place.      You have been referred for a neuropsychological evaluation (i.e., evaluation of memory and thinking abilities). Please bring someone with you to this appointment if possible, as it is helpful for the doctor to hear from both you and another adult who knows you well. Please bring eyeglasses and hearing aids if you wear them.    The evaluation will take approximately 3 hours and has two parts:   The first part is a clinical interview with the neuropsychologist (Dr. Melvyn Novas or Dr. Nicole Kindred). During the interview, the neuropsychologist will speak with you and the individual you brought to the appointment.    The second part of the evaluation is testing with the doctor's technician Hinton Dyer or Maudie Mercury). During the testing, the technician will ask you to remember different types of material, solve problems, and answer some  questionnaires. Your family member will not be present for this portion of the evaluation.   Please note: We must reserve several hours of the neuropsychologist's time and the psychometrician's time for your evaluation appointment. As  such, there is a No-Show fee of $100. If you are unable to attend any of your appointments, please contact our office as soon as possible to reschedule.    FALL PRECAUTIONS: Be cautious when walking. Scan the area for obstacles that may increase the risk of trips and falls. When getting up in the mornings, sit up at the edge of the bed for a few minutes before getting out of bed. Consider elevating the bed at the head end to avoid drop of blood pressure when getting up. Walk always in a well-lit room (use night lights in the walls). Avoid area rugs or power cords from appliances in the middle of the walkways. Use a walker or a cane if necessary and consider physical therapy for balance exercise. Get your eyesight checked regularly.  FINANCIAL OVERSIGHT: Supervision, especially oversight when making financial decisions or transactions is also recommended.  HOME SAFETY: Consider the safety of the kitchen when operating appliances like stoves, microwave oven, and blender. Consider having supervision and share cooking responsibilities until no longer able to participate in those. Accidents with firearms and other hazards in the house should be identified and addressed as well.   ABILITY TO BE LEFT ALONE: If patient is unable to contact 911 operator, consider using LifeLine, or when the need is there, arrange for someone to stay with patients. Smoking is a fire hazard, consider supervision or cessation. Risk of wandering should be assessed by caregiver and if detected at any point, supervision and safe proof recommendations should be instituted.  MEDICATION SUPERVISION: Inability to self-administer medication needs to be constantly addressed. Implement a mechanism to ensure safe administration of the medications.   DRIVING: Regarding driving, in patients with progressive memory problems, driving will be impaired. We advise to have someone else do the driving if trouble finding directions or if minor  accidents are reported. Independent driving assessment is available to determine safety of driving.   If you are interested in the driving assessment, you can contact the following:  [employer] in [place]  [place] [phone] or [phone]    [place] refers to food and lifestyle choices that are based on the traditions of countries located on the [employer]. This way of eating has been shown to help prevent certain conditions and improve outcomes for people who have chronic diseases, like kidney disease and heart disease. What are tips for following this plan? Lifestyle  Cook and eat meals together with your family, when possible. Drink enough fluid to keep your urine clear or pale yellow. Be physically active every day. This includes: Aerobic exercise like running or swimming. Leisure activities like gardening, walking, or housework. Get 7-8 hours of sleep each night. If recommended by your health care provider, drink red wine in moderation. This means 1 glass a day for nonpregnant women and 2 glasses a day for men. A glass of wine equals 5 oz (150 mL). Reading food labels  Check the serving size of packaged foods. For foods such as rice and pasta, the serving size refers to the amount of cooked product, not dry. Check the total fat in packaged foods. Avoid foods that have saturated fat or trans fats.  Check the ingredients list for added sugars, such as corn syrup. Shopping  At the grocery store, buy most of your food from the areas near the walls of the store. This includes: Fresh fruits and vegetables (produce). Grains, beans, nuts, and seeds. Some of these may be available in unpackaged forms or large amounts (in bulk). Fresh seafood. Poultry and eggs. Low-fat dairy products. Buy whole ingredients instead of  prepackaged foods. Buy fresh fruits and vegetables in-season from local farmers markets. Buy frozen fruits and vegetables in resealable bags. If you do not have access to quality fresh seafood, buy precooked frozen shrimp or canned fish, such as tuna, salmon, or sardines. Buy small amounts of raw or cooked vegetables, salads, or olives from the deli or salad bar at your store. Stock your pantry so you always have certain foods on hand, such as olive oil, canned tuna, canned tomatoes, rice, pasta, and beans. Cooking  Cook foods with extra-virgin olive oil instead of using butter or other vegetable oils. Have meat as a side dish, and have vegetables or grains as your main dish. This means having meat in small portions or adding small amounts of meat to foods like pasta or stew. Use beans or vegetables instead of meat in common dishes like chili or lasagna. Experiment with different cooking methods. Try roasting or broiling vegetables instead of steaming or sauteing them. Add frozen vegetables to soups, stews, pasta, or rice. Add nuts or seeds for added healthy fat at each meal. You can add these to yogurt, salads, or vegetable dishes. Marinate fish or vegetables using olive oil, lemon juice, garlic, and fresh herbs. Meal planning  Plan to eat 1 vegetarian meal one day each week. Try to work up to 2 vegetarian meals, if possible. Eat seafood 2 or more times a week. Have healthy snacks readily available, such as: Vegetable sticks with hummus. Greek yogurt. Fruit and nut trail mix. Eat balanced meals throughout the week. This includes: Fruit: 2-3 servings a day Vegetables: 4-5 servings a day Low-fat dairy: 2 servings a day Fish, poultry, or lean meat: 1 serving a day Beans and legumes: 2 or more servings a week Nuts and seeds: 1-2 servings a day Whole grains: 6-8 servings a day Extra-virgin olive oil: 3-4 servings a day Limit red meat and sweets to only a few servings a month What are my  food choices? Mediterranean diet Recommended Grains: Whole-grain pasta. Brown rice. Bulgar wheat. Polenta. Couscous. Whole-wheat bread. [place]. Vegetables: Artichokes. Beets. Broccoli. Cabbage. Carrots. Eggplant. Green beans. Chard. Kale. Spinach. Onions. Leeks. Peas. Squash. Tomatoes. Peppers. Radishes. Fruits: Apples. Apricots. Avocado. Berries. Bananas. Cherries. Dates. Figs. Grapes. Lemons. Melon. Oranges. Peaches. Plums. Pomegranate. Meats and other protein foods: Beans. Almonds. Sunflower seeds. Pine nuts. Peanuts. [place]. Salmon. Scallops. Shrimp. [place]. Tilapia. Clams. Oysters. Eggs. Dairy: Low-fat milk. Cheese. Greek yogurt. Beverages: Water. Red wine. Herbal tea. Fats and oils: Extra virgin olive oil. Avocado oil. Grape seed oil. Sweets and desserts: [place] yogurt with honey. Baked apples. Poached pears. Trail mix. Seasoning and other foods: Basil. Cilantro. Coriander. Cumin. Mint. Parsley. Sage. Rosemary. Tarragon. Garlic. Oregano. Thyme. Pepper. Balsalmic vinegar. Tahini. Hummus. Tomato sauce. Olives. Mushrooms. Limit these Grains: Prepackaged pasta or rice dishes. Prepackaged cereal with added sugar. Vegetables: Deep fried potatoes (french fries). Fruits: Fruit canned in syrup. Meats and other protein foods: Beef. Pork. Lamb. Poultry with skin. Hot dogs. [place] Salines. Dairy: Ice cream. Sour cream. Whole milk. Beverages: Juice. Sugar-sweetened soft drinks. Beer. Liquor and spirits. Fats and oils:  Butter. Canola oil. Vegetable oil. Beef fat (tallow). Lard. Sweets and desserts: Cookies. Cakes. Pies. Candy. Seasoning and other foods: Mayonnaise. Premade sauces and marinades. The items listed may not be a complete list. Talk with your dietitian about what dietary choices are right for you. Summary The Mediterranean diet includes both food and lifestyle choices. Eat a variety of fresh fruits and vegetables, beans, nuts, seeds, and whole grains. Limit the amount of red meat and sweets  that you eat. Talk with your health care provider about whether it is safe for you to drink red wine in moderation. This means 1 glass a day for nonpregnant women and 2 glasses a day for men. A glass of wine equals 5 oz (150 mL). This information is not intended to replace advice given to you by your health care provider. Make sure you discuss any questions you have with your health care provider. Document Released: 10/23/2015 Document Revised: 11/25/2015 Document Reviewed: 10/23/2015 Elsevier Interactive Patient Education  2017 [place]: Be cautious when walking. Scan the area for obstacles that may increase the risk of trips and falls. When getting up in the mornings, sit up at the edge of the bed for a few minutes before getting out of bed. Consider elevating the bed at the head end to avoid drop of blood pressure when getting up. Walk always in a well-lit room (use night lights in the walls). Avoid area rugs or power cords from appliances in the middle of the walkways. Use a walker or a cane if necessary and consider physical therapy for balance exercise. Get your eyesight checked regularly.  HOME SAFETY: Consider the safety of the kitchen when operating appliances like stoves, microwave oven, and blender. Consider having supervision and share cooking responsibilities until no longer able to participate in those. Accidents with firearms and other hazards in the house should be identified and addressed as well.  ABILITY TO BE LEFT ALONE: If patient is unable to contact 911 operator, consider using LifeLine, or when the need is there, arrange for someone to stay with patients. Smoking is a fire hazard, consider supervision or cessation. Risk of wandering should be assessed by caregiver and if detected at any point, supervision and safe proof recommendations should be instituted.   RECOMMENDATIONS FOR ALL PATIENTS WITH MEMORY PROBLEMS: 1. Continue to exercise (Recommend 30  minutes of walking everyday, or 3 hours every week) 2. Increase social interactions - continue going to [place] and enjoy social gatherings with friends and family 3. Eat healthy, avoid fried foods and eat more fruits and vegetables 4. Maintain adequate blood pressure, blood sugar, and blood cholesterol level. Reducing the risk of stroke and cardiovascular disease also helps promoting better memory. 5. Avoid stressful situations. Live a simple life and avoid aggravations. Organize your time and prepare for the next day in anticipation. 6. Sleep well, avoid any interruptions of sleep and avoid any distractions in the bedroom that may interfere with adequate sleep quality 7. Avoid sugar, avoid sweets as there is a strong link between excessive sugar intake, diabetes, and cognitive impairment The Mediterranean diet has been shown to help patients reduce the risk of progressive memory disorders and reduces cardiovascular risk. This includes eating fish, eat fruits and green leafy vegetables, nuts like almonds and hazelnuts, walnuts, and also use olive oil. Avoid fast foods and fried foods as much as possible. Avoid sweets and sugar as sugar use has been linked to worsening of memory function.  There is always a concern of gradual progression of memory problems. If this is the case, then we may need to adjust level of care according to patient needs. Support, both to the patient and caregiver, should then be put into place.

## 2022-05-14 ENCOUNTER — Telehealth: Payer: Self-pay

## 2022-05-14 NOTE — Telephone Encounter (Signed)
Pt's husband called in stating the medication is Rivastigmine. They have a bunch of 3 mg tablets. Can the pt take the 3 mg tablets until they are done? He also wants to ask about what times of day for her to take them.

## 2022-05-14 NOTE — Telephone Encounter (Signed)
No answer at 11:19am, if husband calls back please ask specifics, will call again later.

## 2022-05-14 NOTE — Telephone Encounter (Signed)
Patient's husband called in wondering about the new medication they received, did not disclose which medication but is asking for someone to call back.

## 2022-05-18 ENCOUNTER — Ambulatory Visit: Payer: Medicare Other | Admitting: Psychiatry

## 2022-05-20 NOTE — Progress Notes (Signed)
Another message was left. Closing message because of no return call back.

## 2022-06-27 ENCOUNTER — Other Ambulatory Visit: Payer: Self-pay | Admitting: Neurology

## 2022-09-25 ENCOUNTER — Other Ambulatory Visit: Payer: Self-pay | Admitting: Neurology

## 2022-10-26 ENCOUNTER — Ambulatory Visit: Payer: Medicare Other | Admitting: Psychiatry

## 2022-11-02 ENCOUNTER — Ambulatory Visit: Payer: Medicare Other | Admitting: Psychiatry

## 2022-11-08 ENCOUNTER — Other Ambulatory Visit: Payer: Self-pay | Admitting: Psychiatry

## 2022-11-08 DIAGNOSIS — F22 Delusional disorders: Secondary | ICD-10-CM

## 2022-11-14 NOTE — Progress Notes (Signed)
BH MD/PA/NP OP Progress Note  11/23/2022 1:56 PM Shandora Larsen  MRN:  [id number]  Chief Complaint:  Chief Complaint  Patient presents with   Follow-up   HPI:  According to the chart review, the following events have occurred since the last visit: The patient was seen by neurologist,. Rivastigmine was uptitrated  This is a follow up appointment for neurocognitive disorder, paranoia.  Although she is cooperative during the exam, she answers questions with "I don't know" or responds with short sentences. She states that she has been doing fine.  When she was asked about her children, she states that they are doing good.  She visited them (unable to tell where) not a long time ago. She cannot tell how many grandchildren she has. She tries to go there as often as she can.  She denies feeling depressed or anxiety.  She sleeps well.  She has good appetite.  She denies SI.  She denies paranoia.  She feels confused at time, although she cannot talk about the details.   [name], her husband presents to the visit.  He states that there has been no change.  She has being able to take care of herself.  Although she remembers things happened years ago, she cannot recall the event from last week.  She also does not have a sense of time or the length of time.  Although she has difficulty in communication due to hearing issues, he states that there has been no change when she had a conversation with him.  He states that she is 82 year old, and states "come on, what do you expect (in a politely manner)."  He denies any paranoia, hallucinations.  They play cards, and do puzzles. They walk outside.  He takes care of her medication.  There are no falls.   Orientation- she "don't know" when asked place, time. Able to tell her DOB   Wt Readings from Last 3 Encounters:  11/23/22 149 lb 3.2 oz (67.7 kg)  11/16/22 151 lb 3.2 oz (68.6 kg)  05/12/22 150 lb (68 kg)      Visit Diagnosis:    ICD-10-CM   1. 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 High risk medication use  Z79.899 EKG      Past Psychiatric History: Please see initial evaluation for full details. I have reviewed the history. No updates at this time.     Past Medical History:  Past Medical History:  Diagnosis Date   Essential hypertension 06/12/2013   SBP is a bit high today. 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 Slowly progressive bilateral hearing loss.  People around her are getting more more frustrated.  There is some component of dementia.  No history of ear surgery, trauma or infection. EXAMINATION shows normal external canals and tympanic membranes. 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 No pertinent surgical history.  Family Psychiatric History: Please see  initial evaluation for full details. I have reviewed the history. No updates at this time.     Family History: History reviewed. No pertinent family history.  Social History:  Social History   Socioeconomic History   Marital status: Married    Spouse name: Not on file   Number of children: 2   Years of education: 12   Highest education level: High school graduate  Occupational History   Not on file  Tobacco Use   Smoking status: Never   Smokeless tobacco: Never  Vaping Use   Vaping status: Never Used  Substance and Sexual Activity   Alcohol use: Yes     Comment: wine occ   Drug use: No   Sexual activity: Not Currently  Other Topics Concern   Not on file  Social History Narrative   Right handed    Lives with husband   Social Determinants of Health   Financial Resource Strain: Low Risk  (04/08/2018)   Overall Financial Resource Strain (CARDIA)    Difficulty of Paying Living Expenses: Not hard at all  Food Insecurity: Low Risk  (10/03/2022)   Received from [facility]   Hunger Vital Sign    Worried About Running Out of Food in the Last Year: Never true    Ran Out of Food in the Last Year: Never true  Transportation Needs: Not on file (10/03/2022)  Physical Activity: Insufficiently Active (04/08/2018)   Exercise Vital Sign    Days of Exercise per Week: 2 days    Minutes of Exercise per Session: 30 min  Stress: No Stress Concern Present (04/08/2018)   [employer] of Occupational Health - Occupational Stress Questionnaire    Feeling of Stress : Not at all  Social Connections: Unknown (04/08/2018)   Social Connection and Isolation Panel [NHANES]    Frequency of Communication with Friends and Family: Not on file    Frequency of Social Gatherings with Friends and Family: Not on file    Attends Religious Services: Never    Active Member of Clubs or Organizations: Yes    Attends Engineer, structural: More than 4 times per year    Marital Status: Married    Allergies: No Known Allergies  Metabolic Disorder Labs: Lab Results  Component Value Date   HGBA1C 5.4 06/10/2016   MPG 108 06/10/2016   No results found for: "PROLACTIN" Lab Results  Component Value Date   CHOL 192 06/10/2016   TRIG 106 06/10/2016   HDL 57 06/10/2016   CHOLHDL 3.4 06/10/2016   VLDL 21 06/10/2016   LDLCALC 114 (H) 06/10/2016   Lab Results  Component Value Date   TSH 1.00 06/10/2016    Therapeutic Level Labs: No results found for: "LITHIUM" No results found for: "VALPROATE" No results found for: "CBMZ"  Current Medications: Current  Outpatient Medications  Medication Sig Dispense Refill   CRANBERRY PO Take 650 mg by mouth daily.     L-METHYLFOLATE CALCIUM PO Take 1 tablet by mouth daily.     lisinopril (ZESTRIL) 20 MG tablet Take by mouth daily.  3   Multi Vit-Fluoride-Folic Acid (MULTIVITAMIN/FLUORIDE PO) Take 0.25 mg by mouth daily.     rivastigmine (EXELON) 4.5 MG capsule Take 1 capsule (4.5 mg total) by mouth 2 (two) times daily. 180 capsule 3   rOPINIRole (REQUIP) 1 MG tablet Take 2 tablets by mouth daily.      [START ON 02/06/2023] ARIPiprazole (ABILIFY) 2 MG tablet Take 1 tablet (2 mg total) by mouth  daily. 90 tablet 1   No current facility-administered medications for this visit.     Musculoskeletal: Strength & Muscle Tone: within normal limits Gait & Station: normal Patient leans: N/A  Psychiatric Specialty Exam: Review of Systems  Psychiatric/Behavioral:  Positive for confusion. Negative for agitation, behavioral problems, decreased concentration, dysphoric mood, hallucinations, self-injury, sleep disturbance and suicidal ideas. The patient is not nervous/anxious and is not hyperactive.   All other systems reviewed and are negative.   Blood pressure (!) 163/73, pulse 65, temperature (!) 96.8 F (36 C), temperature source Skin, height 5\' 6"  (1.676 m), weight 149 lb 3.2 oz (67.7 kg).Body mass index is 24.08 kg/m.  General Appearance: Fairly Groomed  Eye Contact:  Good  Speech:  Normal Rate  Volume:  Normal  Mood:   good  Affect:  Appropriate, Congruent, and calm,   Thought Process:  Coherent  Orientation:  Full (Time, Place, and Person)  Thought Content: Logical   Suicidal Thoughts:  No  Homicidal Thoughts:  No  Memory:  Immediate;   Good  Judgement:  Good  Insight:  Present  Psychomotor Activity:  Normal  Concentration:  Concentration: Good and Attention Span: Good  Recall:  Poor  Fund of Knowledge: Good  Language: Good  Akathisia:  No  Handed:  Right  AIMS (if indicated): not done   Assets:  Communication Skills Desire for Improvement  ADL's:  Intact  Cognition: WNL  Sleep:  Good   Screenings: GAD-7    Flowsheet Row Office Visit from 11/26/2021 in [facility]  Total GAD-7 Score 0      Mini-Mental    Flowsheet Row Office Visit from 05/12/2022 in [facility]  Total Score (max 30 points ) 10      PHQ2-9    Flowsheet Row Office Visit from 11/26/2021 in [facility] Video Visit from 12/16/2020 in [facility] Office Visit from 09/08/2020 in [facility]  PHQ-2 Total Score 0 0 0      Flowsheet Row Video Visit from 12/16/2020 in [facility]  C-SSRS RISK CATEGORY No Risk        Assessment and Plan:  Makenli Wolverton is a 82 y.o. year old female with a history of mild neurocognitive disorder, delusion, hypertension, restless leg,  lumbar degenerative disc disease, who presents for follow up appointment for below.   1. Paranoia (HCC) 2. Mild neurocognitive disorder Functional Status Instrumental Activities of Daily Living (IADLs):  Bobbe Hodo is independent in the following: Requires assistance with the following: managing finances, medications [name] will put medication in pillbox), cooking Activities of Daily Living (ADLs):  Juhi Demeter is independent in the following: bathing and hygiene, feeding, continence, grooming and toileting, walking  Folate, Vtamin B12, folate, TSH (wnl 04/2022) Head CT 11/2020 Cerebral volume loss with ex vacuo dilatation of the ventricular system and enlargement of the cerebral sulci. Atrophy of the bilateral hippocampi. Periventricular and deep cerebral white matter hypoattenuation likely the sequela of chronic ischemic microvascular disease.  Neuropsych assessment: Neuropsych eval in 2021 showed concern of  Alzheimer's disease Etiology:  alzheimer, r/o vascular  Exam is notable for cooperative demeanor, although she does not elaborate on story.  She continues to have memory loss, and issues with time perception.  No behavioral issues. Will continue current dose of Rivastigmine to target neurocognitive disorder.  Will continue Abilify to target paranoia.  They were advised again  to obtain EKG to monitor QTc prolongation.   3. High risk medication use Obtain EKG.    Plan  Continue Rivastigmine 4.5 mg twice a day  Continue Abilify 2 mg daily  Obtain EKG - please call [phone] or [phone] to make an appointment  Next appointment: 3/24 at 1 PM   The patient demonstrates the following risk factors for suicide: Chronic risk factors for suicide include: psychiatric disorder of paranoia. Acute risk factors for suicide include: unemployment and recent discharge from inpatient psychiatry. Protective factors for this patient include: positive social support, coping skills and hope for the future. Considering these factors, the overall suicide risk at this point appears to be low. Patient is appropriate for outpatient follow up.      Collaboration of Care: Collaboration of Care: Other reviewed notes in [facility]  Patient/Guardian was advised Release of Information must be obtained prior to any record release in order to collaborate their care with an outside provider. Patient/Guardian was advised if they have not already done so to contact the registration department to sign all necessary forms in order for [place] to release information regarding their care.   Consent: Patient/Guardian gives verbal consent for treatment and assignment of benefits for services provided during this visit. Patient/Guardian expressed understanding and agreed to proceed.    Neysa Hotter, MD 11/23/2022, 1:56 PM

## 2022-11-16 ENCOUNTER — Encounter: Payer: Self-pay | Admitting: Physician Assistant

## 2022-11-16 ENCOUNTER — Ambulatory Visit (INDEPENDENT_AMBULATORY_CARE_PROVIDER_SITE_OTHER): Payer: Medicare Other | Admitting: Physician Assistant

## 2022-11-16 VITALS — BP 153/61 | HR 70 | Ht 66.0 in | Wt 151.2 lb

## 2022-11-16 DIAGNOSIS — G309 Alzheimer's disease, unspecified: Secondary | ICD-10-CM | POA: Diagnosis not present

## 2022-11-16 DIAGNOSIS — F0280 Dementia in other diseases classified elsewhere without behavioral disturbance: Secondary | ICD-10-CM | POA: Diagnosis not present

## 2022-11-16 NOTE — Progress Notes (Signed)
Assessment/Plan:   Dementia likely due to Alzheimer's Disease  Ariel Archer is a very pleasant 81 y.o. RH female from [place] with a history of hypertension, DJD, osteopenia, paranoia followed by behavioral health, and a history of major neurocognitive disorder due to Alzheimer's disease seen today in follow up for memory loss.  Patient is on rivastigmine 4.5 mg twice daily, having been increased from 3 mg twice daily during her prior visit on March 2024.      Follow up in  6 months. Recommend good control of her cardiovascular risk factors Continue rivastigmine 4.5 mg twice daily.  Side effects discussed behavioral health visits with Dr. Vanetta Shawl (paranoia), she is on Abilify 2 mg daily Recommend hearing evaluation for hearing loss and adjustment of their hearing aids Continue to control mood as per PCP     Subjective:    This patient is accompanied in the office by her husband who supplements the history.  Previous records as well as any outside records available were reviewed prior to todays visit. Patient was last seen on March 2024 with an MMSE of 10/30    Any changes in memory since last visit? "About the same".  She may forget names of people and new information. She enjoys playing cards and puzzles, she has to write things down to help her remember. "She forgets about time, the hours the days, etc"  She attends the [employer] with her husband 3 times a week. She likes to go gambling to [place]. Goes to a [employer] for dementia called " [facility]" which helps her husband as a caregiver. . repeats oneself?  Endorsed Disoriented when walking into a room?  Patient denies    Leaving objects in unusual places?  May misplace things but not in unusual places   Wandering behavior?  denies   Any personality changes since last visit?  denies   Any worsening depression?:  Denies.   Hallucinations or paranoia?  Denies.   Seizures? denies    Any sleep changes?  Denies vivid  dreams, REM behavior or sleepwalking   Sleep apnea?   Denies.   Any hygiene concerns? Denies.  Independent of bathing and dressing?  Endorsed  Does the patient needs help with medications?   Husband is in charge   Who is in charge of the finances?  Husband is in charge    Any changes in appetite?  Denies.     Patient have trouble swallowing? Denies.   Does the patient cook? No Any headaches?   Denies.   Chronic back pain  denies.   Ambulates with difficulty? Denies.  Recent falls or head injuries? Denies.     Unilateral weakness, numbness or tingling? denies   Any tremors?  Denies   Any anosmia?  Denies   Any incontinence of urine?  Denies  Any bowel dysfunction?   Denies      Patient lives with her husband.  If her condition worsens, he may entertain memory care for her for safety and for social interaction.  Does the patient drive? No longer drives     PREVIOUS MEDICATIONS:   CURRENT MEDICATIONS:  Outpatient Encounter Medications as of 11/16/2022  Medication Sig   ARIPiprazole (ABILIFY) 2 MG tablet Take 1 tablet (2 mg total) by mouth daily.   CRANBERRY PO Take 650 mg by mouth daily.   L-METHYLFOLATE CALCIUM PO Take 1 tablet by mouth daily.   lisinopril (ZESTRIL) 20 MG tablet Take by mouth daily.   Multi Vit-Fluoride-Folic  Acid (MULTIVITAMIN/FLUORIDE PO) Take 0.25 mg by mouth daily.   rivastigmine (EXELON) 4.5 MG capsule Take 1 capsule (4.5 mg total) by mouth 2 (two) times daily.   rOPINIRole (REQUIP) 1 MG tablet Take 2 tablets by mouth daily.    No facility-administered encounter medications on file as of 11/16/2022.       05/12/2022    3:00 PM  MMSE - Mini Mental State Exam  Orientation to time 0  Orientation to Place 0  Registration 3  Attention/ Calculation 0  Recall 0  Language- name 2 objects 2  Language- repeat 1  Language- follow 3 step command 3  Language- read & follow direction 1  Write a sentence 0  Copy design 0  Total score 10       No data to display           Objective:     PHYSICAL EXAMINATION:    VITALS:   Vitals:   11/16/22 1454  BP: (!) 153/61  Pulse: 70  SpO2: 98%  Weight: 151 lb 3.2 oz (68.6 kg)  Height: 5\' 6"  (1.676 m)    GEN:  The patient appears stated age and is in NAD. HEENT:  Normocephalic, atraumatic.   Neurological examination:  General: NAD, well-groomed, appears stated age. Orientation: The patient is alert. Oriented to person, not to place and date Cranial nerves: There is good facial symmetry.The speech is fluent in [name], and clear. No aphasia or dysarthria. Fund of knowledge is reduced. Recent and remote memory are impaired. Attention and concentration are reduced.  Able to name objects and unable to repeat phrases.  Hearing is intact to conversational tone.  Sensation: Sensation is intact to light touch throughout Motor: Strength is at least antigravity x4. DTR's 2/4 in UE/LE     Movement examination: Tone: There is normal tone in the UE/LE Abnormal movements:  no tremor.  No myoclonus.  No asterixis.   Coordination:  There is some decremation with RAM's. Normal finger to nose  Gait and Station: The patient has no difficulty arising out of a deep-seated chair without the use of the hands. The patient's stride length is good.  Gait is cautious and narrow.    Thank you for allowing [place] the opportunity to participate in the care of this nice patient. Please do not hesitate to contact us for any questions or concerns.   Total time spent on today's visit was 20 minutes dedicated to this patient today, preparing to see patient, examining the patient, ordering tests and/or medications and counseling the patient, documenting clinical information in the EHR or other health record, independently interpreting results and communicating results to the patient/family, discussing treatment and goals, answering patient's questions and coordinating care.  Cc:  Rocky Link [place], [place]  Marlowe Kays 11/16/2022  8:53 PM

## 2022-11-16 NOTE — Patient Instructions (Signed)
Recommendations:  Good to see you. Continue Rivastigmine 4.5 mg twice a day. Continue follow-up with Dr. Vanetta Shawl. Follow-up in 6 months, call for any changes [facility] on [employer]  It was a pleasure to see you today at our office.   Whom to call:  Memory  decline, memory medications: Call our office [phone]   For psychiatric meds, mood meds: Please have your primary care physician manage these medications.   Counseling regarding caregiver distress, including caregiver depression, anxiety and issues regarding community resources, adult day care programs, adult living facilities, or memory care questions:   Feel free to contact Misty Lisabeth Register, Social Worker at [phone]   For assessment of decision of mental capacity and competency:  Call Dr. Erick Blinks, geriatric psychiatrist at [phone]  For guidance in geriatric dementia issues please call [facility] [phone]  For guidance regarding [facility] and if placement were needed at the facility, contact Sidney Ace, Social Worker tel: [phone]  If you have any severe symptoms of a stroke, or other severe issues such as confusion,severe chills or fever, etc call 911 or go to the ER as you may need to be evaluated further     RECOMMENDATIONS FOR ALL PATIENTS WITH MEMORY PROBLEMS: 1. Continue to exercise (Recommend 30 minutes of walking everyday, or 3 hours every week) 2. Increase social interactions - continue going to [place] and enjoy social gatherings with friends and family 3. Eat healthy, avoid fried foods and eat more fruits and vegetables 4. Maintain adequate blood pressure, blood sugar, and blood cholesterol level. Reducing the risk of stroke and cardiovascular disease also helps promoting better memory. 5. Avoid stressful situations. Live a simple life and avoid aggravations. Organize your time and prepare for the next day in anticipation. 6. Sleep well,  avoid any interruptions of sleep and avoid any distractions in the bedroom that may interfere with adequate sleep quality 7. Avoid sugar, avoid sweets as there is a strong link between excessive sugar intake, diabetes, and cognitive impairment We discussed the Mediterranean diet, which has been shown to help patients reduce the risk of progressive memory disorders and reduces cardiovascular risk. This includes eating fish, eat fruits and green leafy vegetables, nuts like almonds and hazelnuts, walnuts, and also use olive oil. Avoid fast foods and fried foods as much as possible. Avoid sweets and sugar as sugar use has been linked to worsening of memory function.  There is always a concern of gradual progression of memory problems. If this is the case, then we may need to adjust level of care according to patient needs. Support, both to the patient and caregiver, should then be put into place.      You have been referred for a neuropsychological evaluation (i.e., evaluation of memory and thinking abilities). Please bring someone with you to this appointment if possible, as it is helpful for the doctor to hear from both you and another adult who knows you well. Please bring eyeglasses and hearing aids if you wear them.    The evaluation will take approximately 3 hours and has two parts:   The first part is a clinical interview with the neuropsychologist (Dr. Milbert Coulter or Dr. Roseanne Reno). During the interview, the neuropsychologist will speak with you and the individual you brought to the appointment.    The second part of the evaluation is testing with the doctor's technician Annabelle Harman or Selena Batten). During the testing, the technician will ask you to remember different types of  material, solve problems, and answer some questionnaires. Your family member will not be present for this portion of the evaluation.   Please note: We must reserve several hours of the neuropsychologist's time and the psychometrician's time for  your evaluation appointment. As such, there is a No-Show fee of $100. If you are unable to attend any of your appointments, please contact our office as soon as possible to reschedule.    FALL PRECAUTIONS: Be cautious when walking. Scan the area for obstacles that may increase the risk of trips and falls. When getting up in the mornings, sit up at the edge of the bed for a few minutes before getting out of bed. Consider elevating the bed at the head end to avoid drop of blood pressure when getting up. Walk always in a well-lit room (use night lights in the walls). Avoid area rugs or power cords from appliances in the middle of the walkways. Use a walker or a cane if necessary and consider physical therapy for balance exercise. Get your eyesight checked regularly.  FINANCIAL OVERSIGHT: Supervision, especially oversight when making financial decisions or transactions is also recommended.  HOME SAFETY: Consider the safety of the kitchen when operating appliances like stoves, microwave oven, and blender. Consider having supervision and share cooking responsibilities until no longer able to participate in those. Accidents with firearms and other hazards in the house should be identified and addressed as well.   ABILITY TO BE LEFT ALONE: If patient is unable to contact 911 operator, consider using LifeLine, or when the need is there, arrange for someone to stay with patients. Smoking is a fire hazard, consider supervision or cessation. Risk of wandering should be assessed by caregiver and if detected at any point, supervision and safe proof recommendations should be instituted.  MEDICATION SUPERVISION: Inability to self-administer medication needs to be constantly addressed. Implement a mechanism to ensure safe administration of the medications.   DRIVING: Regarding driving, in patients with progressive memory problems, driving will be impaired. We advise to have someone else do the driving if trouble  finding directions or if minor accidents are reported. Independent driving assessment is available to determine safety of driving.   If you are interested in the driving assessment, you can contact the following:  [employer] in [place] [phone]  [facility] [phone]  [facility] [phone] or [phone]    Mediterranean Diet A Mediterranean diet refers to food and lifestyle choices that are based on the traditions of countries located on the [employer]. This way of eating has been shown to help prevent certain conditions and improve outcomes for people who have chronic diseases, like kidney disease and heart disease. What are tips for following this plan? Lifestyle  Cook and eat meals together with your family, when possible. Drink enough fluid to keep your urine clear or pale yellow. Be physically active every day. This includes: Aerobic exercise like running or swimming. Leisure activities like gardening, walking, or housework. Get 7-8 hours of sleep each night. If recommended by your health care provider, drink red wine in moderation. This means 1 glass a day for nonpregnant women and 2 glasses a day for men. A glass of wine equals 5 oz (150 mL). Reading food labels  Check the serving size of packaged foods. For foods such as rice and pasta, the serving size refers to the amount of cooked product, not dry. Check the total fat in packaged foods. Avoid foods that  have saturated fat or trans fats. Check the ingredients list for added sugars, such as corn syrup. Shopping  At the grocery store, buy most of your food from the areas near the walls of the store. This includes: Fresh fruits and vegetables (produce). Grains, beans, nuts, and seeds. Some of these may be available in unpackaged forms or large amounts (in bulk). Fresh seafood. Poultry and eggs. Low-fat dairy products. Buy whole  ingredients instead of prepackaged foods. Buy fresh fruits and vegetables in-season from local farmers markets. Buy frozen fruits and vegetables in resealable bags. If you do not have access to quality fresh seafood, buy precooked frozen shrimp or canned fish, such as tuna, salmon, or sardines. Buy small amounts of raw or cooked vegetables, salads, or olives from the deli or salad bar at your store. Stock your pantry so you always have certain foods on hand, such as olive oil, canned tuna, canned tomatoes, rice, pasta, and beans. Cooking  Cook foods with extra-virgin olive oil instead of using butter or other vegetable oils. Have meat as a side dish, and have vegetables or grains as your main dish. This means having meat in small portions or adding small amounts of meat to foods like pasta or stew. Use beans or vegetables instead of meat in common dishes like chili or lasagna. Experiment with different cooking methods. Try roasting or broiling vegetables instead of steaming or sauteing them. Add frozen vegetables to soups, stews, pasta, or rice. Add nuts or seeds for added healthy fat at each meal. You can add these to yogurt, salads, or vegetable dishes. Marinate fish or vegetables using olive oil, lemon juice, garlic, and fresh herbs. Meal planning  Plan to eat 1 vegetarian meal one day each week. Try to work up to 2 vegetarian meals, if possible. Eat seafood 2 or more times a week. Have healthy snacks readily available, such as: Vegetable sticks with hummus. Greek yogurt. Fruit and nut trail mix. Eat balanced meals throughout the week. This includes: Fruit: 2-3 servings a day Vegetables: 4-5 servings a day Low-fat dairy: 2 servings a day Fish, poultry, or lean meat: 1 serving a day Beans and legumes: 2 or more servings a week Nuts and seeds: 1-2 servings a day Whole grains: 6-8 servings a day Extra-virgin olive oil: 3-4 servings a day Limit red meat and sweets to only a few servings  a month What are my food choices? Mediterranean diet Recommended Grains: Whole-grain pasta. Brown rice. Bulgar wheat. Polenta. Couscous. Whole-wheat bread. Orpah Cobb. Vegetables: Artichokes. Beets. Broccoli. Cabbage. Carrots. Eggplant. Green beans. Chard. Kale. Spinach. Onions. Leeks. Peas. Squash. Tomatoes. Peppers. Radishes. Fruits: Apples. Apricots. Avocado. Berries. Bananas. Cherries. Dates. Figs. Grapes. Lemons. Melon. Oranges. Peaches. Plums. Pomegranate. Meats and other protein foods: Beans. Almonds. Sunflower seeds. Pine nuts. Peanuts. Cod. Salmon. Scallops. Shrimp. Tuna. Tilapia. Clams. Oysters. Eggs. Dairy: Low-fat milk. Cheese. Greek yogurt. Beverages: Water. Red wine. Herbal tea. Fats and oils: Extra virgin olive oil. Avocado oil. Grape seed oil. Sweets and desserts: [place] yogurt with honey. Baked apples. Poached pears. Trail mix. Seasoning and other foods: Basil. Cilantro. Coriander. Cumin. Mint. Parsley. Sage. Rosemary. Tarragon. Garlic. Oregano. Thyme. Pepper. Balsalmic vinegar. Tahini. Hummus. Tomato sauce. Olives. Mushrooms. Limit these Grains: Prepackaged pasta or rice dishes. Prepackaged cereal with added sugar. Vegetables: Deep fried potatoes (french fries). Fruits: Fruit canned in syrup. Meats and other protein foods: Beef. Pork. Lamb. Poultry with skin. Hot dogs. Tomasa Blase. Dairy: Ice cream. Sour cream. Whole milk. Beverages: Juice. Sugar-sweetened soft drinks. Beer.  Liquor and spirits. Fats and oils: Butter. Canola oil. Vegetable oil. Beef fat (tallow). Lard. Sweets and desserts: Cookies. Cakes. Pies. Candy. Seasoning and other foods: Mayonnaise. Premade sauces and marinades. The items listed may not be a complete list. Talk with your dietitian about what dietary choices are right for you. Summary The Mediterranean diet includes both food and lifestyle choices. Eat a variety of fresh fruits and vegetables, beans, nuts, seeds, and whole grains. Limit the amount of  red meat and sweets that you eat. Talk with your health care provider about whether it is safe for you to drink red wine in moderation. This means 1 glass a day for nonpregnant women and 2 glasses a day for men. A glass of wine equals 5 oz (150 mL). This information is not intended to replace advice given to you by your health care provider. Make sure you discuss any questions you have with your health care provider. Document Released: 10/23/2015 Document Revised: 11/25/2015 Document Reviewed: 10/23/2015 Elsevier Interactive Patient Education  2017 Elsevier Inc.      FALL PRECAUTIONS: Be cautious when walking. Scan the area for obstacles that may increase the risk of trips and falls. When getting up in the mornings, sit up at the edge of the bed for a few minutes before getting out of bed. Consider elevating the bed at the head end to avoid drop of blood pressure when getting up. Walk always in a well-lit room (use night lights in the walls). Avoid area rugs or power cords from appliances in the middle of the walkways. Use a walker or a cane if necessary and consider physical therapy for balance exercise. Get your eyesight checked regularly.  HOME SAFETY: Consider the safety of the kitchen when operating appliances like stoves, microwave oven, and blender. Consider having supervision and share cooking responsibilities until no longer able to participate in those. Accidents with firearms and other hazards in the house should be identified and addressed as well.  ABILITY TO BE LEFT ALONE: If patient is unable to contact 911 operator, consider using LifeLine, or when the need is there, arrange for someone to stay with patients. Smoking is a fire hazard, consider supervision or cessation. Risk of wandering should be assessed by caregiver and if detected at any point, supervision and safe proof recommendations should be instituted.   RECOMMENDATIONS FOR ALL PATIENTS WITH MEMORY PROBLEMS: 1. Continue to  exercise (Recommend 30 minutes of walking everyday, or 3 hours every week) 2. Increase social interactions - continue going to [place] and enjoy social gatherings with friends and family 3. Eat healthy, avoid fried foods and eat more fruits and vegetables 4. Maintain adequate blood pressure, blood sugar, and blood cholesterol level. Reducing the risk of stroke and cardiovascular disease also helps promoting better memory. 5. Avoid stressful situations. Live a simple life and avoid aggravations. Organize your time and prepare for the next day in anticipation. 6. Sleep well, avoid any interruptions of sleep and avoid any distractions in the bedroom that may interfere with adequate sleep quality 7. Avoid sugar, avoid sweets as there is a strong link between excessive sugar intake, diabetes, and cognitive impairment The Mediterranean diet has been shown to help patients reduce the risk of progressive memory disorders and reduces cardiovascular risk. This includes eating fish, eat fruits and green leafy vegetables, nuts like almonds and hazelnuts, walnuts, and also use olive oil. Avoid fast foods and fried foods as much as possible. Avoid sweets and sugar as sugar use has been  linked to worsening of memory function.  There is always a concern of gradual progression of memory problems. If this is the case, then we may need to adjust level of care according to patient needs. Support, both to the patient and caregiver, should then be put into place.

## 2022-11-23 ENCOUNTER — Encounter: Payer: Self-pay | Admitting: Psychiatry

## 2022-11-23 ENCOUNTER — Ambulatory Visit (INDEPENDENT_AMBULATORY_CARE_PROVIDER_SITE_OTHER): Payer: Medicare Other | Admitting: Psychiatry

## 2022-11-23 VITALS — BP 163/73 | HR 65 | Temp 96.8°F | Ht 66.0 in | Wt 149.2 lb

## 2022-11-23 DIAGNOSIS — G3184 Mild cognitive impairment, so stated: Secondary | ICD-10-CM

## 2022-11-23 DIAGNOSIS — F22 Delusional disorders: Secondary | ICD-10-CM

## 2022-11-23 DIAGNOSIS — Z79899 Other long term (current) drug therapy: Secondary | ICD-10-CM | POA: Diagnosis not present

## 2022-11-23 MED ORDER — ARIPIPRAZOLE 2 MG PO TABS: 2.0000 mg | ORAL_TABLET | Freq: Every day | ORAL | 1 refills | Status: DC

## 2022-11-23 NOTE — Patient Instructions (Addendum)
Continue Rivastigmine 4.5 mg twice a day  Continue Abilify 2 mg daily   Obtain EKG - please call [phone] or [phone] to make an appointment  Next appointment: 3/24 at 1 PM

## 2023-02-04 ENCOUNTER — Other Ambulatory Visit: Payer: Self-pay

## 2023-02-04 ENCOUNTER — Emergency Department (HOSPITAL_BASED_OUTPATIENT_CLINIC_OR_DEPARTMENT_OTHER)
Admission: EM | Admit: 2023-02-04 | Discharge: 2023-02-04 | Disposition: A | Discharge status: Home / Self Care | Payer: Medicare Other | Attending: Emergency Medicine | Admitting: Emergency Medicine

## 2023-02-04 DIAGNOSIS — L03811 Cellulitis of head [any part, except face]: Secondary | ICD-10-CM | POA: Diagnosis not present

## 2023-02-04 DIAGNOSIS — F0390 Unspecified dementia without behavioral disturbance: Secondary | ICD-10-CM | POA: Diagnosis not present

## 2023-02-04 DIAGNOSIS — R220 Localized swelling, mass and lump, head: Secondary | ICD-10-CM | POA: Diagnosis present

## 2023-02-04 DIAGNOSIS — Z79899 Other long term (current) drug therapy: Secondary | ICD-10-CM | POA: Insufficient documentation

## 2023-02-04 DIAGNOSIS — I10 Essential (primary) hypertension: Secondary | ICD-10-CM | POA: Diagnosis not present

## 2023-02-04 MED ORDER — CEPHALEXIN 500 MG PO CAPS: 500.0000 mg | ORAL_CAPSULE | Freq: Four times a day (QID) | ORAL | 0 refills | Status: DC

## 2023-02-04 MED ORDER — MUPIROCIN 2 % EX OINT: TOPICAL_OINTMENT | Freq: Two times a day (BID) | CUTANEOUS | 0 refills | Status: AC

## 2023-02-04 NOTE — ED Notes (Signed)

## 2023-02-04 NOTE — ED Triage Notes (Signed)
Pt with redness and swelling to LT ear lobe for a few days

## 2023-02-04 NOTE — ED Provider Notes (Signed)
[facility] Provider Note   CSN: [id number] Arrival date & time: 02/04/23  1702     History  Chief Complaint  Patient presents with   Wound Check    Ariel Archer is a 82 y.o. female with past medical history seen for dementia, hypertension, lumbar radiculopathy, endorsing redness, swelling to left earlobe over a few days.  Husband concerned that she does not bathe enough.  No other redness or irritation noted.   Wound Check       Home Medications Prior to Admission medications   Medication Sig Start Date End Date Taking? Authorizing Provider  cephALEXin (KEFLEX) 500 MG capsule Take 1 capsule (500 mg total) by mouth 4 (four) times daily. 02/04/23  Yes Linetta Regner H, PA-C  mupirocin ointment (BACTROBAN) 2 % Apply topically 2 (two) times daily. For one week 02/04/23  Yes Danella Philson H, PA-C  ARIPiprazole (ABILIFY) 2 MG tablet Take 1 tablet (2 mg total) by mouth daily. 02/06/23 08/05/23  Neysa Hotter, MD  CRANBERRY PO Take 650 mg by mouth daily.    [provider]  L-METHYLFOLATE CALCIUM PO Take 1 tablet by mouth daily. 06/07/16   [provider]  lisinopril (ZESTRIL) 20 MG tablet Take by mouth daily. 07/05/16   [provider]  Multi Vit-Fluoride-Folic Acid (MULTIVITAMIN/FLUORIDE PO) Take 0.25 mg by mouth daily.    [provider]  rivastigmine (EXELON) 4.5 MG capsule Take 1 capsule (4.5 mg total) by mouth 2 (two) times daily. 05/12/22   Marcos Eke, PA-C  rOPINIRole (REQUIP) 1 MG tablet Take 2 tablets by mouth daily.  06/06/15   [provider]      Allergies    Patient has no known allergies.    Review of Systems   Review of Systems  All other systems reviewed and are negative.   Physical Exam Updated Vital Signs BP (!) 189/69 (BP Location: Right Arm)   Pulse 64   Temp 98 F (36.7 C) (Oral)   Resp 20   Ht 5\' 6"  (1.676 m)   Wt 67.6 kg   SpO2 100%   BMI  24.05 kg/m  Physical Exam Vitals and nursing note reviewed.  Constitutional:      General: She is not in acute distress.    Appearance: Normal appearance.  HENT:     Head: Normocephalic and atraumatic.  Eyes:     General:        Right eye: No discharge.        Left eye: No discharge.  Cardiovascular:     Rate and Rhythm: Normal rate and regular rhythm.     Heart sounds: No murmur heard.    No friction rub. No gallop.  Pulmonary:     Effort: Pulmonary effort is normal.     Breath sounds: Normal breath sounds.  Abdominal:     General: Bowel sounds are normal.     Palpations: Abdomen is soft.  Skin:    General: Skin is warm and dry.     Capillary Refill: Capillary refill takes less than 2 seconds.     Comments: Redness, swelling, tenderness to palpation of the left earlobe  Neurological:     Mental Status: She is alert and oriented to person, place, and time.  Psychiatric:        Mood and Affect: Mood normal.        Behavior: Behavior normal.     ED Results / Procedures / Treatments  Labs (all labs ordered are listed, but only abnormal results are displayed) Labs Reviewed - No data to display  EKG None  Radiology No results found.  Procedures Procedures    Medications Ordered in ED Medications - No data to display  ED Course/ Medical Decision Making/ A&P                                 Medical Decision Making  This patient is a 82 y.o. female who presents to the ED for concern of left earlobe redness, tenderness, swelling, pain.   Differential diagnoses prior to evaluation: Cellulitis, abscess, versus other  Past Medical History / Social History / Additional history: Chart reviewed. Pertinent results include: Hypertension, advanced age, dementia  Physical Exam: Physical exam performed. The pertinent findings include: Redness, swelling, tenderness to palpation of the left earlobe  Elevated blood pressure, 189/69, she denies any chest pain, headache,  she does endorse taking her blood pressure medication today.  Encourage close follow-up with PCP if remaining elevated  Medications / Treatment: Mupirocin, Keflex for what appears to be simple cellulitis of the left earlobe, encourage close PCP follow-up   Disposition: After consideration of the diagnostic results and the patients response to treatment, I feel that patient stable for discharge with plan as above.   emergency department workup does not suggest an emergent condition requiring admission or immediate intervention beyond what has been performed at this time. The plan is: as above. The patient is safe for discharge and has been instructed to return immediately for worsening symptoms, change in symptoms or any other concerns.  Final Clinical Impression(s) / ED Diagnoses Final diagnoses:  Cellulitis of head except face    Rx / DC Orders ED Discharge Orders          Ordered    cephALEXin (KEFLEX) 500 MG capsule  4 times daily        02/04/23 1741    mupirocin ointment (BACTROBAN) 2 %  2 times daily        02/04/23 1741              Kyoko Elsea, Harrel Carina, PA-C 02/04/23 1749    Glyn Ade, MD 02/04/23 2142

## 2023-02-04 NOTE — Discharge Instructions (Addendum)
Recommend that you bathe and clean the area on the ear once daily to once every other day, please apply the topical cream that I provided twice daily for 1 week, and take the entire course of antibiotics that I prescribed.

## 2023-02-28 ENCOUNTER — Telehealth: Payer: Self-pay

## 2023-02-28 ENCOUNTER — Other Ambulatory Visit: Payer: Self-pay | Admitting: Psychiatry

## 2023-02-28 MED ORDER — ARIPIPRAZOLE 2 MG PO TABS: 4.0000 mg | ORAL_TABLET | Freq: Every day | ORAL | 0 refills | Status: DC

## 2023-02-28 NOTE — Telephone Encounter (Signed)
Discussed with [name], her husband. They moved within a city about a month ago. Yesterday, she had an episode of pushy against [name] when he tried to prevent her going outside. She was telling that she wants to be back to [place]. No hallucinations, no other aggression. [name] would like her to be on higher dose of Abilify. He agrees with the plans below - increase Abilify 4 mg daily  - he will fax EKG result to [place] (ED provider reportedly told him that EKG is without abnormality) - on the waiting list for sooner visit

## 2023-02-28 NOTE — Telephone Encounter (Signed)
pt husband called states that sunday he had to take Ariel Archer to the [facility]. she woke up stating that she needed to go back home to [place]. that her mom and dad miss her. he stood in the doorway to keep her from going outside. at that point she become aggressive. He states that the doctor told him to call office and see if the abilify could be increased to 4mg .  He would like to speak with dr. Vanetta Shawl if possible.  Pt has been added to the wait list.

## 2023-06-02 NOTE — Progress Notes (Addendum)
 BH MD/PA/NP OP Progress Note  06/06/2023 5:27 PM Ariel Archer  MRN:  [id number]  Chief Complaint:  Chief Complaint  Patient presents with   Follow-up   HPI:  - since the last visit the following has occurred  "Discussed with [name], her husband. They moved within a city about a month ago. Yesterday, she had an episode of pushy against [name] when he tried to prevent her going outside. She was telling that she wants to be back to [place]. No hallucinations, no other aggression. [name] would like her to be on higher dose of Abilify."  Abilify was up titrated to 4 mg.   Berda is a limited historian. She responds with short sentences.  She responds with "nothing" when she is asked about her daily routine.  She agrees when [name] advised that she has been playing cards and puzzles. She sleeps well. She denies SI, HI, hallucinations.   Orientation- "I don't know" when asked about the date. When she is asked to guess, she responds with her birthday.   [name], her husband presents to the visit.  There was an episode of them having an argument. [name] advised her to take a shower twice a day as she has been taking it only weekly. She became upset, and pushed him. No other behavioral issues otherwise.  Her daughter invited them to move into the house. They have separate room, and rarely see their daughter, who works most of the time. She goes to a day program "[employer]" twice a week. They play cards, physical activity, and coloring.  [name] denies any concern since uptitration of Abilify. No known SI, HI, hallucinations. She sleeps well, and has good appetite. No significant change in memory or ADL.No fall. No known episode of aspiration. [name] asks the care to be transferred to [facility] as they need to drive 45 miles to here.   Wt Readings from Last 3 Encounters:  06/06/23 150 lb 3.2 oz (68.1 kg)  02/04/23 149 lb (67.6 kg)  11/23/22 149 lb 3.2 oz (67.7 kg)       Visit Diagnosis:    ICD-10-CM   1.  Paranoia (HCC)  F22     2. Major neurocognitive disorder (HCC)  F03.90 Ambulatory referral to Occupational Therapy      Past Psychiatric History: Please see initial evaluation for full details. I have reviewed the history. No updates at this time.     Past Medical History:  Past Medical History:  Diagnosis Date   Essential hypertension 06/12/2013   SBP is a bit high today. 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 Slowly progressive bilateral hearing loss.  People around her are getting more more frustrated.  There is some component of dementia.  No history of ear surgery, trauma or infection. EXAMINATION shows normal external canals and tympanic membranes. 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 No pertinent surgical history.  Family Psychiatric History: Please see initial evaluation for full details. I have reviewed the history. No updates at  this time.     Family History: History reviewed. No pertinent family history.  Social History:  Social History   Socioeconomic History   Marital status: Married    Spouse name: Not on file   Number of children: 2   Years of education: 12   Highest education level: High school graduate  Occupational History   Not on file  Tobacco Use   Smoking status: Never   Smokeless tobacco: Never  Vaping Use   Vaping status: Never Used  Substance and Sexual Activity   Alcohol use: Yes    Comment: wine occ    Drug use: No   Sexual activity: Not Currently  Other Topics Concern   Not on file  Social History Narrative   Right handed    Lives with husband   Social Drivers of Health   Financial Resource Strain: Low Risk  (04/08/2018)   Overall Financial Resource Strain (CARDIA)    Difficulty of Paying Living Expenses: Not hard at all  Food Insecurity: Low Risk  (10/03/2022)   Received from [facility]   Hunger Vital Sign    Worried About Running Out of Food in the Last Year: Never true    Ran Out of Food in the Last Year: Never true  Transportation Needs: Not on file (10/03/2022)  Physical Activity: Insufficiently Active (04/08/2018)   Exercise Vital Sign    Days of Exercise per Week: 2 days    Minutes of Exercise per Session: 30 min  Stress: No Stress Concern Present (04/08/2018)   [employer] of Occupational Health - Occupational Stress Questionnaire    Feeling of Stress : Not at all  Social Connections: Unknown (04/08/2018)   Social Connection and Isolation Panel [NHANES]    Frequency of Communication with Friends and Family: Not on file    Frequency of Social Gatherings with Friends and Family: Not on file    Attends Religious Services: Never    Active Member of Clubs or Organizations: Yes    Attends Engineer, structural: More than 4 times per year    Marital Status: Married    Allergies: No Known Allergies  Metabolic Disorder Labs: Lab Results  Component Value Date   HGBA1C 5.4 06/10/2016   MPG 108 06/10/2016   No results found for: "PROLACTIN" Lab Results  Component Value Date   CHOL 192 06/10/2016   TRIG 106 06/10/2016   HDL 57 06/10/2016   CHOLHDL 3.4 06/10/2016   VLDL 21 06/10/2016   LDLCALC 114 (H) 06/10/2016   Lab Results  Component Value Date   TSH 1.00 06/10/2016    Therapeutic Level Labs: No results found for: "LITHIUM" No results found for: "VALPROATE" No results found for: "CBMZ"  Current Medications: Current Outpatient Medications   Medication Sig Dispense Refill   cephALEXin (KEFLEX) 500 MG capsule Take 1 capsule (500 mg total) by mouth 4 (four) times daily. 20 capsule 0   CRANBERRY PO Take 650 mg by mouth daily.     L-METHYLFOLATE CALCIUM PO Take 1 tablet by mouth daily.     lisinopril (ZESTRIL) 20 MG tablet Take by mouth daily.  3   Multi Vit-Fluoride-Folic Acid (MULTIVITAMIN/FLUORIDE PO) Take 0.25 mg by mouth daily.     mupirocin ointment (BACTROBAN) 2 % Apply topically 2 (two) times daily. For one week 15 g 0   rivastigmine (EXELON) 4.5 MG capsule Take 1 capsule (4.5 mg total) by mouth 2 (two) times daily. 180 capsule 3   rOPINIRole (REQUIP) 1 MG  tablet Take 2 tablets by mouth daily.      ARIPiprazole (ABILIFY) 2 MG tablet Take 2 tablets (4 mg total) by mouth daily. 180 tablet 0   No current facility-administered medications for this visit.     Musculoskeletal: Strength & Muscle Tone: within normal limits Gait & Station: normal Patient leans: N/A  Psychiatric Specialty Exam: Review of Systems  Psychiatric/Behavioral:  Positive for agitation and behavioral problems. Negative for confusion, decreased concentration, dysphoric mood, hallucinations, self-injury, sleep disturbance and suicidal ideas. The patient is not nervous/anxious and is not hyperactive.   All other systems reviewed and are negative.   Blood pressure (!) 152/85, pulse 63, temperature (!) 96.8 F (36 C), temperature source Skin, height 5\' 6"  (1.676 m), weight 150 lb 3.2 oz (68.1 kg).Body mass index is 24.24 kg/m.  General Appearance: Well Groomed  Eye Contact:  Good  Speech:   answers only with a short sentence  Volume:  Normal  Mood:   "good"  Affect:  Appropriate and calm, less reactivity  Thought Process:  Coherent  Orientation:  Other:  oriented to self only  Thought Content: Logical, no paranoia   Suicidal Thoughts:  No  Homicidal Thoughts:  No  Memory:  Immediate;   Fair  Judgement:  Fair  Insight:  Shallow  Psychomotor  Activity:  Normal, slightly increased tonus in bilateral arms, +bilateral postural tremors, no resting tremors, no cogwheel rigidity  Concentration:  Concentration: Good and Attention Span: Good  Recall:  Poor  Fund of Knowledge: Good  Language: Good  Akathisia:  No  Handed:  Right  AIMS (if indicated): 0   Assets:  Social Support  ADL's:  Impaired  Cognition: Impaired,  Mild  Sleep:  Good   Screenings: GAD-7    Flowsheet Row Office Visit from 11/26/2021 in [facility]  Total GAD-7 Score 0      Mini-Mental    Flowsheet Row Office Visit from 05/12/2022 in [facility]  Total Score (max 30 points ) 10      PHQ2-9    Flowsheet Row Office Visit from 11/26/2021 in [facility] Video Visit from 12/16/2020 in [facility] Office Visit from 09/08/2020 in [facility]  PHQ-2 Total Score 0 0 0      Flowsheet Row Video Visit from 12/16/2020 in [facility]  C-SSRS RISK CATEGORY No Risk        Assessment and Plan:  Ger Nicks is a 83 y.o. year old female with a history of mild neurocognitive disorder, delusion, hypertension, restless leg,  lumbar degenerative disc disease, who presents for follow up appointment for below.   1. Paranoia (HCC) 2. Major neurocognitive disorder Functional Status Instrumental Activities of Daily Living (IADLs):  Latresha Feltes is independent in the following: Requires assistance with the following: managing finances, medications [name] will put medication in pillbox), cooking Activities of Daily Living (ADLs):  Swayzee Aikman is independent in the following: bathing (less frequent, needs a reminder) and hygiene, feeding, continence, grooming and toileting, walking  Folate, Vtamin B12, folate, TSH (wnl 04/2022) Head CT 11/2020 Cerebral volume  loss with ex vacuo dilatation of the ventricular system and enlargement of the cerebral sulci. Atrophy of the bilateral hippocampi. Periventricular and deep cerebral white matter hypoattenuation likely the sequela of chronic ischemic microvascular disease.  Neuropsych assessment: Neuropsych eval in 2021 showed concern of Alzheimer's disease.  (MOCA 22/30 on  06/2016, 17/30 on 09/2017, delayed recall 0/3 08/2019) Etiology:  alzheimer, r/o vascular   She continues to demonstrate a calm demeanor, although she does not elaborate on her story, which is consistent with her presentation during the previous visit. She had an episode of increased irritability, which has been subsided since uptitration of Abilify.  Will continue current dose of Abilify to target behavioral issues, and paranoia associated with neurocognitive disorder. Previously discussed regarding the risk of increased mortality, stroke in patients with dementia. Will continue rivastigmine to target neurocognitive disorder.  Although she may benefit from uptitration, will stay on the current dose to avoid risk of bradycardia.  Will make referral for OT for [facility] assessment/rehabilitation as needed.     3. High risk medication use She has PCP visit in April. Metabolic panels will be rechecked.     Last checked  EKG HR 89, QTc 02/2023  Lipid panels LDL 122 04/2022- pcp visit this April  HbA1c Glu 94 04/2022 - PCP visit this April      Plan  Continue Rivastigmine 4.5 mg twice a day  Continue Abilify 4 mg daily Referral to occupational therapy Next appointment: n/a. Plan to transfer to [place] office based on the patient request   The patient demonstrates the following risk factors for suicide: Chronic risk factors for suicide include: psychiatric disorder of paranoia. Acute risk factors for suicide include: unemployment and recent discharge from inpatient psychiatry. Protective factors for this patient include: positive social support,  coping skills and hope for the future. Considering these factors, the overall suicide risk at this point appears to be low. Patient is appropriate for outpatient follow up.  Collaboration of Care: Collaboration of Care: Other reviewed notes in Epic , sent a message to provider in [place] for coordination of care  Patient/Guardian was advised Release of Information must be obtained prior to any record release in order to collaborate their care with an outside provider. Patient/Guardian was advised if they have not already done so to contact the registration department to sign all necessary forms in order for [place] to release information regarding their care.   Consent: Patient/Guardian gives verbal consent for treatment and assignment of benefits for services provided during this visit. Patient/Guardian expressed understanding and agreed to proceed.    Neysa Hotter, MD 06/06/2023, 5:27 PM

## 2023-06-06 ENCOUNTER — Encounter: Payer: Self-pay | Admitting: Psychiatry

## 2023-06-06 ENCOUNTER — Other Ambulatory Visit: Payer: Self-pay

## 2023-06-06 ENCOUNTER — Ambulatory Visit (INDEPENDENT_AMBULATORY_CARE_PROVIDER_SITE_OTHER): Payer: Medicare Other | Admitting: Psychiatry

## 2023-06-06 VITALS — BP 152/85 | HR 63 | Temp 96.8°F | Ht 66.0 in | Wt 150.2 lb

## 2023-06-06 DIAGNOSIS — F22 Delusional disorders: Secondary | ICD-10-CM | POA: Diagnosis not present

## 2023-06-06 DIAGNOSIS — F0390 Unspecified dementia without behavioral disturbance: Secondary | ICD-10-CM

## 2023-06-06 MED ORDER — ARIPIPRAZOLE 2 MG PO TABS: 4.0000 mg | ORAL_TABLET | Freq: Every day | ORAL | 0 refills | Status: DC

## 2023-06-06 NOTE — Patient Instructions (Signed)
 Continue Rivastigmine 4.5 mg twice a day  Continue Abilify 4 mg daily Next appointment: n/a. Transfer to [employer] office

## 2023-06-07 ENCOUNTER — Encounter: Payer: Self-pay | Admitting: Neurology

## 2023-06-07 ENCOUNTER — Ambulatory Visit (INDEPENDENT_AMBULATORY_CARE_PROVIDER_SITE_OTHER): Payer: Medicare Other | Admitting: Neurology

## 2023-06-07 VITALS — BP 142/61 | HR 81 | Ht 66.0 in | Wt 151.2 lb

## 2023-06-07 DIAGNOSIS — F0280 Dementia in other diseases classified elsewhere without behavioral disturbance: Secondary | ICD-10-CM

## 2023-06-07 DIAGNOSIS — G309 Alzheimer's disease, unspecified: Secondary | ICD-10-CM | POA: Diagnosis not present

## 2023-06-07 MED ORDER — RIVASTIGMINE TARTRATE 4.5 MG PO CAPS: 4.5000 mg | ORAL_CAPSULE | Freq: Two times a day (BID) | ORAL | 3 refills | Status: AC

## 2023-06-07 NOTE — Patient Instructions (Signed)
 Good to see you! Continue all your medications. Follow-up with Memory Disorders PA Marlowe Kays in 6 months. Call for any changes.   FALL PRECAUTIONS: Be cautious when walking. Scan the area for obstacles that may increase the risk of trips and falls. When getting up in the mornings, sit up at the edge of the bed for a few minutes before getting out of bed. Consider elevating the bed at the head end to avoid drop of blood pressure when getting up. Walk always in a well-lit room (use night lights in the walls). Avoid area rugs or power cords from appliances in the middle of the walkways. Use a walker or a cane if necessary and consider physical therapy for balance exercise. Get your eyesight checked regularly.   HOME SAFETY: Consider the safety of the kitchen when operating appliances like stoves, microwave oven, and blender. Consider having supervision and share cooking responsibilities until no longer able to participate in those. Accidents with firearms and other hazards in the house should be identified and addressed as well.   ABILITY TO BE LEFT ALONE: If patient is unable to contact 911 operator, consider using LifeLine, or when the need is there, arrange for someone to stay with patients. Smoking is a fire hazard, consider supervision or cessation. Risk of wandering should be assessed by caregiver and if detected at any point, supervision and safe proof recommendations should be instituted.  MEDICATION SUPERVISION: Inability to self-administer medication needs to be constantly addressed. Implement a mechanism to ensure safe administration of the medications.  RECOMMENDATIONS FOR ALL PATIENTS WITH MEMORY PROBLEMS: 1. Continue to exercise (Recommend 30 minutes of walking everyday, or 3 hours every week) 2. Increase social interactions - continue going to [place] [facility] and enjoy social gatherings with friends and family 3. Eat healthy, avoid fried foods and eat more fruits and vegetables 4. Maintain  adequate blood pressure, blood sugar, and blood cholesterol level. Reducing the risk of stroke and cardiovascular disease also helps promoting better memory. 5. Avoid stressful situations. Live a simple life and avoid aggravations. Organize your time and prepare for the next day in anticipation. 6. Sleep well, avoid any interruptions of sleep and avoid any distractions in the bedroom that may interfere with adequate sleep quality 7. Avoid sugar, avoid sweets as there is a strong link between excessive sugar intake, diabetes, and cognitive impairment The Mediterranean diet has been shown to help patients reduce the risk of progressive memory disorders and reduces cardiovascular risk. This includes eating fish, eat fruits and green leafy vegetables, nuts like almonds and hazelnuts, walnuts, and also use olive oil. Avoid fast foods and fried foods as much as possible. Avoid sweets and sugar as sugar use has been linked to worsening of memory function.  There is always a concern of gradual progression of memory problems. If this is the case, then we may need to adjust level of care according to patient needs. Support, both to the patient and caregiver, should then be put into place.

## 2023-06-07 NOTE — Progress Notes (Signed)
 NEUROLOGY FOLLOW UP OFFICE NOTE  Ariel Archer [id number] 06/03/1940  HISTORY OF PRESENT ILLNESS: I had the pleasure of seeing Ariel Archer in follow-up in the neurology clinic on 84/25/2025.  The patient was last seen by Memory Disorders PA Marlowe Kays 6 months ago for Alzheimer's dementia with behavioral disturbance. She is again accompanied by her husband [name] who helps supplement the history today.  Records and images were personally reviewed where available.  Since their last visit, he reports that aside from the incident in December 2024, she has been overall stable, with more good than bad days. She was in the ER in 02/2023 after pushing her husband for wanting to see her deceased parents. Abilify dose increased to 4mg  daily. She is on Rivastigmine 4.5mg  BID for dementia. Her husband denies any paranoia in 7 years, she has continued to follow-up with Psychiatry and asked Dr. Vanetta Shawl for transfer to a closer psychiatrist since they have moved in with there daughter in [place]. His main difficulty is getting her to shower. She needs assistance with all ADLs, he manages medications, finances, meals. Sleep is okay. She reports memory is okay, mood is "okay too." She denies any headaches, dizziness, vision changes, focal numbness/tingling/weakness, no falls. She goes to [facility] 2 days a week.   History on Initial Assessment 01/14/2020: This is an 83 year old right-handed woman with a history of hypertension, RLS, depression, presenting for evaluation of dementia. She is accompanied by her husband who helps supplement the history today. Records from her recent Neuropsychological evaluation done 12/2019 were reviewed. Her husband states symptoms started around 2018 when she started having paranoia about a neighbor. She was evaluated by Dr. Vanetta Shawl with Psychiatry with a MOCA score of 22/30. She had an MRI brain without contrast in 2018 which I personally reviewed, no acute changes, there is mild  diffuse atrophy. She states "I don't forget things." Her husband started noticing changes a year ago, she was forgetful, she would not remember events that happened such as their trips. Once she started Abilify a year ago, the paranoia resolved. She states she does not take any medications. Her husband manages medications and finances. She says she drives and denies getting lost, however her husband reports that he does the driving. She denies misplacing things, he reports that she is organized. She is independent with dressing and bathing. Her husband denies any further paranoia, no hallucinations. Sleep is good, no REM behavior disorder. She states her mood is good. She likes doing 1000-piece puzzles, playing cards, walking daily. There is no family history of dementia, no history of significant concussions. She very seldom drinks alcohol. She denies any headaches, dizziness, diplopia, dysarthria/dysphagia, neck/back pain, focal numbness/tingling/weakness, anosmia. She is "a little bit shaky."    Neuropsychological evaluation in October 2021 indicated prominent impairments with learning and memory, as well as semantic fluency, confrontation naming, and visuospatial abilities. She met criteria for Major Neurocognitive Disorder, etiology concerning for Alzheimer's disease. Due to initial presentation of paranoia, concern for Lewy body dementia had been raised, however she did not display classic features and this was felt to be very unlikely.    PAST MEDICAL HISTORY: Past Medical History:  Diagnosis Date   Essential hypertension 06/12/2013   SBP is a bit high today. 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 Slowly progressive bilateral hearing loss.  People around her are getting more more frustrated.  There is some component of dementia.  No history of ear surgery, trauma or infection. EXAMINATION shows normal external canals and tympanic membranes. AUDIOGRAM Shows moderate to severe sens   Spondylosis of lumbar region without myelopathy or radiculopathy 04/03/2016    MEDICATIONS: Current Outpatient Medications on File Prior to Visit  Medication Sig Dispense Refill   ARIPiprazole (ABILIFY) 2 MG tablet Take 2 tablets (4 mg total) by mouth daily. 180 tablet 0   CRANBERRY PO Take 650 mg by mouth daily.     L-METHYLFOLATE CALCIUM PO Take 1 tablet by mouth daily.     lisinopril (ZESTRIL) 20 MG tablet Take by mouth daily.  3   Multi Vit-Fluoride-Folic Acid (MULTIVITAMIN/FLUORIDE PO) Take 0.25 mg by mouth daily.     mupirocin ointment (BACTROBAN) 2 % Apply topically 2 (two) times daily. For one week 15 g 0   rivastigmine (EXELON) 4.5 MG capsule Take 1 capsule (4.5 mg total) by mouth 2 (two) times daily. 180 capsule 3   rOPINIRole (REQUIP) 1 MG tablet Take 2 tablets by mouth daily.      No current facility-administered medications on file prior to visit.    ALLERGIES: No Known Allergies  FAMILY HISTORY: No family history on file.  SOCIAL HISTORY: Social History   Socioeconomic History   Marital status: Married    Spouse name: Not on file   Number of children: 2   Years of education: 12   Highest education level: High school graduate  Occupational History   Not on file  Tobacco Use   Smoking status: Never   Smokeless tobacco: Never  Vaping Use   Vaping status: Never Used  Substance and Sexual Activity   Alcohol use: Yes    Comment: wine occ   Drug use: No   Sexual activity: Not Currently  Other Topics Concern   Not on file  Social  History Narrative   Right handed    Lives with husband   Social Drivers of Health   Financial Resource Strain: Low Risk  (04/08/2018)   Overall Financial Resource Strain (CARDIA)    Difficulty of Paying Living Expenses: Not hard at all  Food Insecurity: Low Risk  (10/03/2022)   Received from [facility]   Hunger Vital Sign    Worried About Running Out of Food in the Last Year: Never true    Ran Out of Food in the Last Year: Never true  Transportation Needs: Not on file (10/03/2022)  Physical Activity: Insufficiently Active (04/08/2018)   Exercise Vital Sign    Days of Exercise per Week: 2 days    Minutes of Exercise per Session: 30 min  Stress: No Stress Concern Present (04/08/2018)   [employer] of Occupational Health - Occupational Stress Questionnaire    Feeling of Stress : Not at all  Social Connections: Unknown (04/08/2018)   Social Connection and Isolation Panel [NHANES]    Frequency of Communication with Friends and Family: Not on file    Frequency of Social Gatherings with Friends and Family: Not on file    Attends Religious Services: Never    Active Member of Clubs or Organizations: Yes    Attends  Club or Organization Meetings: More than 4 times per year    Marital Status: Married  Catering manager Violence: Not At Risk (04/08/2018)   Humiliation, Afraid, Rape, and Kick questionnaire    Fear of Current or Ex-Partner: No    Emotionally Abused: No    Physically Abused: No    Sexually Abused: No     PHYSICAL EXAM: Vitals:   06/07/23 1348  BP: (!) 142/61  Pulse: 81  SpO2: 99%   General: No acute distress Head:  Normocephalic/atraumatic Skin/Extremities: No rash, no edema Neurological Exam: alert and oriented to person. Does not know month, location, needed cues. Minimal verbal output, she will answer questions when prompted but otherwise stays quiet. No aphasia or dysarthria. Fund of knowledge is reduced. Recent and remote memory are impaired. Attention and  concentration are reduced, able to spell WORLD, does not want to spell it backwards.  Cranial nerves: Pupils equal, round. Extraocular movements intact with no nystagmus. Visual fields full.  No facial asymmetry.  Motor: Bulk and tone normal, muscle strength 5/5 throughout with no pronator drift.   Finger to nose testing intact.  Gait narrow-based and steady, no ataxia. No tremors.   IMPRESSION: This is an 83 yo RH woman with a history of hypertension, RLS, depression, with Alzheimer's dementia with behavioral disturbance. No further paranoia for 7 years now. Her husband reports she is overall stable, continue Rivastigmine 4.5mg  BID. Psychiatry managing behaviors, she is on Abilify 4mg  daily with good response. We discussed different strategies to try with encouraging showers. Continue close supervision. Follow-up with Memory Disorders PA Marlowe Kays in 6 months, call for any changes.    Thank you for allowing me to participate in her care.  Please do not hesitate to call for any questions or concerns.    Patrcia Dolly, M.D.   CC: Rocky Link, FNP

## 2023-06-21 ENCOUNTER — Encounter: Admitting: Occupational Therapy

## 2023-06-21 ENCOUNTER — Ambulatory Visit: Admitting: Occupational Therapy

## 2023-07-05 ENCOUNTER — Ambulatory Visit (HOSPITAL_COMMUNITY): Admitting: Psychiatry

## 2023-08-09 ENCOUNTER — Ambulatory Visit (HOSPITAL_BASED_OUTPATIENT_CLINIC_OR_DEPARTMENT_OTHER): Admitting: Psychiatry

## 2023-08-09 ENCOUNTER — Other Ambulatory Visit: Payer: Self-pay

## 2023-08-09 ENCOUNTER — Encounter (HOSPITAL_COMMUNITY): Payer: Self-pay | Admitting: Psychiatry

## 2023-08-09 VITALS — BP 188/72 | HR 63 | Ht 62.0 in | Wt 162.0 lb

## 2023-08-09 DIAGNOSIS — F03918 Unspecified dementia, unspecified severity, with other behavioral disturbance: Secondary | ICD-10-CM

## 2023-08-09 MED ORDER — ARIPIPRAZOLE 2 MG PO TABS: 4.0000 mg | ORAL_TABLET | Freq: Every day | ORAL | 2 refills | Status: AC

## 2023-08-09 NOTE — Progress Notes (Signed)
 [facility] MD/PA/NP OP Progress Note  08/09/2023 1:43 PM Ariel Archer  MRN:  [id number]  Chief Complaint:  No chief complaint on file.  HPI:   This patient is an 83 year old white married mother who is seen today with her husband Ariel Archer.  The patient is diagnosed with moderate Alzheimer's dementia.  She can still do all of her basic ADLs.  She has difficulty with language.  She is disoriented.  She is calm and pleasant.  According to [name] her husband she is doing well.  At 1 point she was demonstrating psychotic symptomatology and was treated with Abilify  and has done very well.  This was over a year ago.  The patient is stable behaviorally.  There is no evidence of overt depression or anxiety.  She makes no attempts to elope.  She is sleeping and eating very well.  She drinks no alcohol and uses no drugs.  Her and her husband live in their daughter's home.  They have their own section and live independently.  The patient goes [facility] 2 days a week.  They are also very involved with [facility] memory care.  This support system that does not daycare but also has speakers in his education.  It is very supportive for her husband. Past psychiatric history demonstrates no history of psychiatric hospitalization.  She was seeing Dr.Hisada until the present time says it is more convenient for the patient to be seen in the setting.  The patient is actually quite active in other ways.  She does a lot of puzzling.  She plays cards.  The patient is able to dress herself go to the bathroom on her own.  Her only medical illnesses restless leg syndrome and that she takes Requip for  Wt Readings from Last 3 Encounters:  08/09/23 162 lb (73.5 kg)  06/07/23 151 lb 3.2 oz (68.6 kg)  06/06/23 150 lb 3.2 oz (68.1 kg)       Visit Diagnosis:     Past Psychiatric History: Please see initial evaluation for full details. I have reviewed the history. No updates at this time.     Past Medical History:  Past Medical  History:  Diagnosis Date   Essential hypertension 06/12/2013   SBP is a bit high today. 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 Slowly progressive bilateral hearing loss.  People around her are getting more more frustrated.  There is some component of dementia.  No history of ear surgery, trauma or infection. EXAMINATION shows normal external canals and tympanic membranes. 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 No pertinent surgical history.  Family Psychiatric History: Please see initial evaluation for full details. I have reviewed the history. No updates at this time.     Family History: History reviewed. No pertinent family history.  Social History:  Social History   Socioeconomic History   Marital status: Married    Spouse name: Not on file   Number of children: 2   Years of education: 27  Highest education level: High school graduate  Occupational History   Not on file  Tobacco Use   Smoking status: Never   Smokeless tobacco: Never  Vaping Use   Vaping status: Never Used  Substance and Sexual Activity   Alcohol use: Yes    Comment: wine occ   Drug use: No   Sexual activity: Not Currently  Other Topics Concern   Not on file  Social History Narrative   Right handed    Lives with husband   Social Drivers of Health   Financial Resource Strain: Low Risk  (04/08/2018)   Overall Financial Resource Strain (CARDIA)    Difficulty of  Paying Living Expenses: Not hard at all  Food Insecurity: Low Risk  (07/26/2023)   Received from [facility]   Hunger Vital Sign    Worried About Running Out of Food in the Last Year: Never true    Ran Out of Food in the Last Year: Never true  Transportation Needs: No Transportation Needs (07/26/2023)   Received from [facility]    In the past 12 months, has lack of reliable transportation kept you from medical appointments, meetings, work or from getting things needed for daily living? : No  Physical Activity: Insufficiently Active (04/08/2018)   Exercise Vital Sign    Days of Exercise per Week: 2 days    Minutes of Exercise per Session: 30 min  Stress: No Stress Concern Present (04/08/2018)   [employer] of Occupational Health - Occupational Stress Questionnaire    Feeling of Stress : Not at all  Social Connections: Unknown (04/08/2018)   Social Connection and Isolation Panel [NHANES]    Frequency of Communication with Friends and Family: Not on file    Frequency of Social Gatherings with Friends and Family: Not on file    Attends Religious Services: Never    Active Member of Clubs or Organizations: Yes    Attends Engineer, structural: More than 4 times per year    Marital Status: Married    Allergies: No Known Allergies  Metabolic Disorder Labs: Lab Results  Component Value Date   HGBA1C 5.4 06/10/2016   MPG 108 06/10/2016   No results found for: "PROLACTIN" Lab Results  Component Value Date   CHOL 192 06/10/2016   TRIG 106 06/10/2016   HDL 57 06/10/2016   CHOLHDL 3.4 06/10/2016   VLDL 21 06/10/2016   LDLCALC 114 (H) 06/10/2016   Lab Results  Component Value Date   TSH 1.00 06/10/2016    Therapeutic Level Labs: No results found for: "LITHIUM" No results found for: "VALPROATE" No results found for: "CBMZ"  Current Medications: Current Outpatient Medications  Medication Sig Dispense Refill   CRANBERRY PO Take 650 mg by mouth  daily.     L-METHYLFOLATE CALCIUM PO Take 1 tablet by mouth daily.     lisinopril (ZESTRIL) 20 MG tablet Take by mouth daily.  3   Multi Vit-Fluoride-Folic Acid  (MULTIVITAMIN/FLUORIDE PO) Take 0.25 mg by mouth daily.     rivastigmine  (EXELON ) 4.5 MG capsule Take 1 capsule (4.5 mg total) by mouth 2 (two) times daily. 180 capsule 3   rOPINIRole (REQUIP) 1 MG tablet Take 2 tablets by mouth daily.      ARIPiprazole  (ABILIFY ) 2 MG tablet Take 2 tablets (4 mg total) by mouth daily. 180 tablet 2   mupirocin  ointment (BACTROBAN ) 2 % Apply topically 2 (two) times daily. For one week (Patient not taking: Reported on 08/09/2023) 15 g  0   No current facility-administered medications for this visit.     Musculoskeletal: Strength & Muscle Tone: within normal limits Gait & Station: normal Patient leans: N/A  Psychiatric Specialty Exam: Review of Systems  Psychiatric/Behavioral:  Positive for agitation and behavioral problems. Negative for confusion, decreased concentration, dysphoric mood, hallucinations, self-injury, sleep disturbance and suicidal ideas. The patient is not nervous/anxious and is not hyperactive.   All other systems reviewed and are negative.   Blood pressure (!) 188/72, pulse 63, height 5\' 2"  (1.575 m), weight 162 lb (73.5 kg).Body mass index is 29.63 kg/m.  General Appearance: Well Groomed  Eye Contact:  Good  Speech:  answers only with a short sentence  Volume:  Normal  Mood:  "good"  Affect:  Appropriate and calm, less reactivity  Thought Process:  Coherent  Orientation:  Other:  oriented to self only  Thought Content: Logical, no paranoia   Suicidal Thoughts:  No  Homicidal Thoughts:  No  Memory:  Immediate;   Fair  Judgement:  Fair  Insight:  Shallow  Psychomotor Activity:  Normal, slightly increased tonus in bilateral arms, +bilateral postural tremors, no resting tremors, no cogwheel rigidity  Concentration:  Concentration: Good and Attention Span: Good  Recall:  Poor   Fund of Knowledge: Good  Language: Good  Akathisia:  No  Handed:  Right  AIMS (if indicated): 0   Assets:  Social Support  ADL's:  Impaired  Cognition: Impaired,  Mild  Sleep:  Good   Screenings: GAD-7    Flowsheet Row Office Visit from [facility]/26/20[facility] in [facility]  Total GAD-7 Score 0      Mini-Mental    Flowsheet Row Office Visit from 05/12/2022 in [facility]  Total Score (max 30 points ) 10      PHQ2-9    Flowsheet Row Office Visit from 11/26/2021 in [facility] Video Visit from 12/16/2020 in [facility] Office Visit from 09/08/2020 in [facility]  PHQ-2 Total Score 0 0 0      Flowsheet Row Video Visit from 12/16/2020 in [facility]  C-SSRS RISK CATEGORY No Risk        Assessment and Plan:  At this time on 08/09/2023 this patient is very stable.  This is a ways to transition her care to me.  I will completely accept this patient into care as she is doing very well in her treatment plan is very appropriate and successful.  She will continue taking Abilify  2 mg 2 a day.  She will continue taking Exelon  that she gets from her neurologist.  My recommendations might be to increase her Exelon  to a higher dose and possibly to consider adding Namenda.  Nonetheless from a psychiatry point of view the patient is doing well and that she is not agitated, not depressed and is very cooperative with her caregiver.  Her caregiver giving her excellent care her husband Siegfried Dress is very happy with how his wife is doing.  This patient she will return to see me in 3 months  1. Paranoia (HCC) 2. Major neurocognitive disorder Functional Status Instrumental Activities of Daily Living (IADLs):  Aliannah Natzke is independent in the following: Requires assistance with the following:  managing finances, medications Siegfried Dress will put medication in pillbox), cooking Activities of Daily Living (ADLs):  Alean Bonsall is independent in the following: bathing (less frequent, needs a reminder) and  hygiene, feeding, continence, grooming and toileting, walking  Folate, Vtamin B12, folate, TSH (wnl 04/2022) Head CT 11/2020 Cerebral volume loss with ex vacuo dilatation of the ventricular system and enlargement of the cerebral sulci. Atrophy of the bilateral hippocampi. Periventricular and deep cerebral white matter hypoattenuation likely the sequela of chronic ischemic microvascular disease.  Neuropsych assessment: Neuropsych eval in 2021 showed concern of Alzheimer's disease.  (MOCA 22/30 on 06/2016, 17/30 on 09/2017, delayed recall 0/3 08/2019) Etiology:  alzheimer, r/o vascular   She continues to demonstrate a calm demeanor, although she does not elaborate on her story, which is consistent with her presentation during the previous visit. She had an episode of increased irritability, which has been subsided since uptitration of Abilify .  Will continue current dose of Abilify  to target behavioral issues, and paranoia associated with neurocognitive disorder. Previously discussed regarding the risk of increased mortality, stroke in patients with dementia. Will continue rivastigmine  to target neurocognitive disorder.  Although she may benefit from uptitration, will stay on the current dose to avoid risk of bradycardia.  Will make referral for OT for [facility] assessment/rehabilitation as needed.     3. High risk medication use She has PCP visit in April. Metabolic panels will be rechecked.     Last checked  EKG HR 89, QTc 02/2023  Lipid panels LDL 122 04/2022- pcp visit this April  HbA1c Glu 94 04/2022 - PCP visit this April      Plan  Continue Rivastigmine  4.5 mg twice a day  Continue Abilify  4 mg daily Referral to occupational therapy Next appointment: n/a. 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 Acute risk factors for suicide include: unemployment and recent discharge from inpatient psychiatry. Protective factors for this patient include: positive social support, coping skills and hope for the future. Considering these factors, the overall suicide risk at this point appears to be low. Patient is appropriate for outpatient follow up.  Collaboration of Care: Collaboration of Care: Other reviewed notes in Epic, sent a message to provider in [place] for coordination of care  Patient/Guardian was advised Release of Information must be obtained prior to any record release in order to collaborate their care with an outside provider. Patient/Guardian was advised if they have not already done so to contact the registration department to sign all necessary forms in order for us  to release information regarding their care.   Consent: Patient/Guardian gives verbal consent for treatment and assignment of benefits for services provided during this visit. Patient/Guardian expressed understanding and agreed to proceed.    Delorse Fey, MD 08/09/2023, 1:43 PM

## 2023-11-03 ENCOUNTER — Ambulatory Visit (HOSPITAL_COMMUNITY): Admitting: Psychiatry

## 2023-11-09 ENCOUNTER — Ambulatory Visit (HOSPITAL_COMMUNITY): Admitting: Psychiatry

## 2023-11-15 ENCOUNTER — Ambulatory Visit (HOSPITAL_COMMUNITY): Admitting: Psychiatry

## 2023-11-23 ENCOUNTER — Ambulatory Visit (HOSPITAL_COMMUNITY): Admitting: Psychiatry

## 2023-12-13 ENCOUNTER — Ambulatory Visit: Admitting: Physician Assistant
